# Patient Record
Sex: Female | Born: 1945 | Race: White | Hispanic: No | Marital: Married | Smoking: Never smoker
Health system: Southern US, Community
[De-identification: ages and names within clinical notes are randomized; demographics above are authoritative.]

## PROBLEM LIST (undated history)

## (undated) DIAGNOSIS — J189 Pneumonia, unspecified organism: Secondary | ICD-10-CM

## (undated) DIAGNOSIS — K219 Gastro-esophageal reflux disease without esophagitis: Secondary | ICD-10-CM

## (undated) DIAGNOSIS — K5190 Ulcerative colitis, unspecified, without complications: Secondary | ICD-10-CM

## (undated) DIAGNOSIS — I341 Nonrheumatic mitral (valve) prolapse: Secondary | ICD-10-CM

## (undated) DIAGNOSIS — D591 Autoimmune hemolytic anemia, unspecified: Secondary | ICD-10-CM

## (undated) DIAGNOSIS — E039 Hypothyroidism, unspecified: Secondary | ICD-10-CM

## (undated) DIAGNOSIS — S62102A Fracture of unspecified carpal bone, left wrist, initial encounter for closed fracture: Secondary | ICD-10-CM

## (undated) DIAGNOSIS — S82201A Unspecified fracture of shaft of right tibia, initial encounter for closed fracture: Secondary | ICD-10-CM

## (undated) DIAGNOSIS — R011 Cardiac murmur, unspecified: Secondary | ICD-10-CM

## (undated) DIAGNOSIS — Z9289 Personal history of other medical treatment: Secondary | ICD-10-CM

## (undated) HISTORY — PX: EXPLORATORY LAPAROTOMY: SUR591

## (undated) HISTORY — PX: LAPAROTOMY: SHX154

## (undated) HISTORY — DX: Cardiac murmur, unspecified: R01.1

## (undated) HISTORY — DX: Pneumonia, unspecified organism: J18.9

## (undated) HISTORY — PX: APPENDECTOMY: SHX54

## (undated) HISTORY — DX: Hypothyroidism, unspecified: E03.9

## (undated) HISTORY — PX: TONSILLECTOMY: SUR1361

## (undated) HISTORY — DX: Nonrheumatic mitral (valve) prolapse: I34.1

## (undated) HISTORY — PX: SPLENECTOMY: SUR1306

---

## 1997-12-25 ENCOUNTER — Ambulatory Visit (HOSPITAL_COMMUNITY): Admission: RE | Admit: 1997-12-25 | Discharge: 1997-12-25 | Payer: Self-pay | Admitting: Pediatrics

## 1998-01-07 ENCOUNTER — Encounter (HOSPITAL_COMMUNITY): Admission: RE | Admit: 1998-01-07 | Discharge: 1998-04-07 | Payer: Self-pay | Admitting: Pediatrics

## 1998-01-15 ENCOUNTER — Inpatient Hospital Stay (HOSPITAL_COMMUNITY): Admission: EM | Admit: 1998-01-15 | Discharge: 1998-01-17 | Payer: Self-pay | Admitting: Emergency Medicine

## 2002-02-16 ENCOUNTER — Emergency Department (HOSPITAL_COMMUNITY): Admission: EM | Admit: 2002-02-16 | Discharge: 2002-02-16 | Payer: Self-pay | Admitting: Emergency Medicine

## 2002-02-16 ENCOUNTER — Encounter: Payer: Self-pay | Admitting: Emergency Medicine

## 2003-01-25 ENCOUNTER — Encounter: Payer: Self-pay | Admitting: Emergency Medicine

## 2003-01-25 ENCOUNTER — Emergency Department (HOSPITAL_COMMUNITY): Admission: EM | Admit: 2003-01-25 | Discharge: 2003-01-25 | Payer: Self-pay | Admitting: *Deleted

## 2003-01-29 ENCOUNTER — Ambulatory Visit (HOSPITAL_COMMUNITY): Admission: RE | Admit: 2003-01-29 | Discharge: 2003-01-29 | Payer: Self-pay | Admitting: Cardiology

## 2003-05-30 ENCOUNTER — Encounter: Payer: Self-pay | Admitting: Gastroenterology

## 2003-05-30 ENCOUNTER — Encounter: Admission: RE | Admit: 2003-05-30 | Discharge: 2003-05-30 | Payer: Self-pay | Admitting: Gastroenterology

## 2003-07-10 ENCOUNTER — Encounter: Admission: RE | Admit: 2003-07-10 | Discharge: 2003-07-10 | Payer: Self-pay | Admitting: Obstetrics and Gynecology

## 2003-08-28 ENCOUNTER — Other Ambulatory Visit: Admission: RE | Admit: 2003-08-28 | Discharge: 2003-08-28 | Payer: Self-pay | Admitting: Obstetrics and Gynecology

## 2003-12-12 ENCOUNTER — Observation Stay (HOSPITAL_COMMUNITY): Admission: RE | Admit: 2003-12-12 | Discharge: 2003-12-13 | Payer: Self-pay | Admitting: Obstetrics and Gynecology

## 2004-11-17 ENCOUNTER — Inpatient Hospital Stay (HOSPITAL_COMMUNITY): Admission: EM | Admit: 2004-11-17 | Discharge: 2004-11-21 | Payer: Self-pay | Admitting: Emergency Medicine

## 2004-11-17 ENCOUNTER — Ambulatory Visit: Payer: Self-pay | Admitting: Physical Medicine & Rehabilitation

## 2005-05-26 ENCOUNTER — Encounter (HOSPITAL_COMMUNITY): Admission: RE | Admit: 2005-05-26 | Discharge: 2005-08-24 | Payer: Self-pay | Admitting: Endocrinology

## 2005-09-24 ENCOUNTER — Ambulatory Visit (HOSPITAL_COMMUNITY): Admission: RE | Admit: 2005-09-24 | Discharge: 2005-09-24 | Payer: Self-pay | Admitting: Endocrinology

## 2005-12-13 ENCOUNTER — Ambulatory Visit (HOSPITAL_BASED_OUTPATIENT_CLINIC_OR_DEPARTMENT_OTHER): Admission: RE | Admit: 2005-12-13 | Discharge: 2005-12-13 | Payer: Self-pay | Admitting: Specialist

## 2006-02-02 ENCOUNTER — Ambulatory Visit (HOSPITAL_COMMUNITY): Admission: RE | Admit: 2006-02-02 | Discharge: 2006-02-02 | Payer: Self-pay | Admitting: Endocrinology

## 2006-06-01 ENCOUNTER — Encounter (HOSPITAL_COMMUNITY): Admission: RE | Admit: 2006-06-01 | Discharge: 2006-06-01 | Payer: Self-pay | Admitting: Endocrinology

## 2006-07-26 ENCOUNTER — Inpatient Hospital Stay (HOSPITAL_COMMUNITY): Admission: AD | Admit: 2006-07-26 | Discharge: 2006-07-27 | Payer: Self-pay | Admitting: Orthopedic Surgery

## 2006-11-07 ENCOUNTER — Encounter (HOSPITAL_COMMUNITY): Admission: RE | Admit: 2006-11-07 | Discharge: 2007-01-18 | Payer: Self-pay | Admitting: Endocrinology

## 2007-05-18 ENCOUNTER — Encounter (HOSPITAL_COMMUNITY): Admission: RE | Admit: 2007-05-18 | Discharge: 2007-07-12 | Payer: Self-pay | Admitting: Endocrinology

## 2010-12-25 NOTE — Op Note (Signed)
NAME:  Joan [name], BROCKMAN                         ACCOUNT NO.:  [id number]   MEDICAL RECORD NO.:  [id number]                   PATIENT TYPE:  OBV   LOCATION:  9399                                 FACILITY:  [facility]   PHYSICIAN:  Hal Morales, M.D.             DATE OF BIRTH:  Nov 13, 1945   DATE OF PROCEDURE:  12/12/2003  DATE OF DISCHARGE:                                 OPERATIVE REPORT   PREOPERATIVE DIAGNOSIS:  Symptomatic cystocele and desire for uterine  preservation.   POSTOPERATIVE DIAGNOSIS:  Symptomatic cystocele and desire for uterine  preservation.   OPERATION:  Anterior colporrhaphy.   ANESTHESIA:  General LMA.   SURGEON:  Dr. Dierdre Forth.   FIRST ASSISTANT:  Henreitta Leber, P.A.   ESTIMATED BLOOD LOSS:  Minimal, less than 50 mL.   COMPLICATIONS:  None.   FINDINGS:  The patient had a cystocele that came to within 4 cm of the  introitus.  She also had some cervical descensus.   DESCRIPTION OF PROCEDURE:  The patient was taken to the operating room after  appropriate identification and placed on the operating table.  After the  attainment of adequate anesthesia she was placed in the modified lithotomy  position.  Perineum and vagina were prepped with multiple layers of Betadine  and draped as a sterile field.  A Foley catheter was inserted into the  bladder and connected to straight drainage.  A weighted speculum was placed  in the posterior vagina.  Allis clamps were placed at the urethrovesical  junction and the anterior vaginal mucosa injected with a dilute solution of  Pitressin along the midline.  The anterior vaginal mucosa was then incised  in the midline from the cervical vaginal junction to the urethrovesical  angle.  The anterior vaginal mucosa was then dissected off the pubocervical  fascia on the right and left sides along that length.  The pubocervical  fascia was then reapproximated in the midline to allow reduction of the  cystocele and kept in  place with mattress sutures of 2-0 chromic in two  layers.  The redundant vaginal mucosa was then excised and the remaining  vaginal mucosa reapproximated in the midline with a running suture of 2-0  chromic.  Hemostasis was noted to be adequate.  Vaginal packing moistened  with Estrace cream was then placed in the vagina and the patient awakened  from general anesthesia and taken to the recovery room in satisfactory  condition having tolerated the procedure well, with sponge and instrument  counts correct.                                               Hal Morales, M.D.    VPH/MEDQ  D:  12/12/2003  T:  12/12/2003  Job:  [id number]

## 2010-12-25 NOTE — H&P (Signed)
NAME:  Joan [name], Joan [name]                         ACCOUNT NO.:  [id number]   MEDICAL RECORD NO.:  [id number]                   PATIENT TYPE:  OIB   LOCATION:  2899                                 FACILITY:  [facility]   PHYSICIAN:  Colleen Can. Deborah Chalk, M.D.            DATE OF BIRTH:  [birth year]   DATE OF ADMISSION:  01/29/2003  DATE OF DISCHARGE:                                HISTORY & PHYSICAL   CHIEF COMPLAINT:  Chest pain.   HISTORY OF PRESENT ILLNESS:  The patient is a very pleasant 65 year old  white female who has had a history of exertional chest pain and has been  previously evaluated with negative stress Cardiolite testing. She has had a  rate related left bundle branch block. She has had a 2-D echocardiogram,  which showed trace mitral regurgitation, impaired relaxation as well as mild  aortic valve sclerosis with LV function of 60-65%. She has had ongoing bouts  of chest pain refractory to medical therapy, which includes beta blocker,  sublingual nitroglycerin. She was seen this past Friday in the emergency  room and was felt to have chest wall discomfort, which was treated with  Darvocet and Naprosyn. She presents to the office on 01/28/03 with recurrent  spells of chest pain. She is now referred for elective cardiac  catheterization.   PAST MEDICAL HISTORY:  1. Left bundle branch block.  2. Chest pain with previous stress Cardiolite testing by Dr. Jenne Campus on     December 06, 2002, which showed diaphragmatic attenuation without ischemia     and ejection fraction of 77%.  3. History of intraventricular conduction disturbance.  4. Palpitations.  5. Colitis.  6. Previous history of autoimmune hemolytic anemia requiring splenectomy.  7. History of a heart murmur with most recent 2-D echocardiogram as     described above.   ALLERGIES:  Aminoglycosides and morphine.   CURRENT MEDICATIONS:  1. Nafoxadol 500 mg 2 tablets b.i.d. for colitis.  2. Pen-Vee-K 500 mg b.i.d. for  colitis.  3. Synthroid daily.  4. Cytomel daily.  5. Fiber capsules two daily.  6. Fish oil one capsule three times a day.  7. Calcium twice a day.  8. Aspirin daily.  9. Vitamins and minerals.   SOCIAL HISTORY:  She is married. she has no current alcohol or tobacco.   FAMILY HISTORY:  Father died at 73 with an MI and was a smoker. Mother died  at 62 with a stroke. She has had one sister who has had heart trouble and  multiple aunts and uncles with heart trouble as well. She has four children.  Her 58 year old son has cystic fibrosis and had a myocardial infarction at  the age of 73.   REVIEW OF SYMPTOMS:  Basically as noted above and is otherwise unremarkable.   PHYSICAL EXAMINATION:  VITAL SIGNS:  Weight 143, blood pressure 120/80,  heart rate 70s, respirations 18. She is afebrile.  SKIN:  Warm and dry. Color is unremarkable.  LUNGS:  Clear.  HEART:  Regular rhythm. She does have mild chest wall tenderness upon  palpation.  ABDOMEN:  Soft, positive bowel sounds, nontender.  EXTREMITIES:  Without edema.  NEUROLOGIC:  Intact with no gross focal deficits.   LABORATORY DATA:  Labs are pending.   IMPRESSION:  1. Recurrent bouts of chest pain of uncertain etiology.  2. Known rate related left bundle branch block.  3. Recently negative stress Cardiolite testing.  4. Mild valvular disease.  5. Colitis.  6. Hypothyroidism.   PLAN:  We will proceed on with elective cardiac catheterization. The  procedure has been reviewed in full detail and she is willing to proceed on  Tuesday, January 29, 2003.     Juanell Fairly C. Earl Gala, N.P.                 Colleen Can. Deborah Chalk, M.D.    LCO/MEDQ  D:  01/29/2003  T:  01/29/2003  Job:  [id number]   cc:   Cassell Clement, M.D.  [address]  [place]  [place] [address]  Fax: [phone]    cc:   Cassell Clement, M.D.  [address], [address]  [place]  [place] [address]  Fax: [phone]

## 2010-12-25 NOTE — Op Note (Signed)
[name], Joan [name]               ACCOUNT NO.:  [id number]   MEDICAL RECORD NO.:  [id number]          PATIENT TYPE:  AMB   LOCATION:  NESC                         FACILITY:  [facility]   PHYSICIAN:  Jene Every, M.D.    DATE OF BIRTH:  1945-09-16   DATE OF PROCEDURE:  12/13/2005  DATE OF DISCHARGE:                                 OPERATIVE REPORT   PREOPERATIVE DIAGNOSIS:  Painful retained hardware of the right leg status  post intramedullary nailing of the right tibia.   POSTOPERATIVE DIAGNOSIS:  Painful retained hardware of the right leg status  post intramedullary nailing of the right tibia.   PROCEDURE PERFORMED:  1.  Removal of distal locking screw of the right tibial rod.  2.  Removal of both proximal locking screws of the tibial rod.   ANESTHESIA:  General local.   BRIEF HISTORY INDICATIONS:  This is a 65 year old status post intramedullary  rodding of the mid tib-fib fracture that has gone on to heal and she has had  painful retained hardware, more of the proximal end of the most distal screw  head.  She was nontender over the proximal screw of the distal set.  She was  indicated for removal of the proximal screws and the distal screw.  I have  discussed removing all of the screws; however, I prefer she have one  transfixion screw to keep the rod from migrating.  We then discussed the  risks and benefits including bleeding and infection, need for eventual rod  removal, etc.   TECHNIQUE:  With the patient in the supine position, after the induction of  adequate general anesthesia and 1g Kefzol, the right lower extremity was  thoroughly prepped and draped in the usual sterile fashions.  The distal  screw head was palpated just proximal to the medial malleolus.  Incision was  made through the skin and then bluntly dissected down through the  subcutaneous tissue.  Granulation tissue was noted over the screw head, this  was removed, the screw head was identified.  It was  removed without  difficulty.  The screw hole was irrigated and curetted and then closed with  a #4-0 nylon simple suture.  In a similar fashion, the two proximal screws  were removed, incisions through the skin only, and blunt dissection down to  the screw head, identification of the screw head and removal of the screw  with the screwdriver without difficulty.  The tips were intact.  We curetted  both screw holes.  We copiously irrigated them.  Both were infiltrated with  0.25% Marcaine with epinephrine.  A nylon suture was utilized to close the  skin.  The the wound was dressed with Adaptic, 4 x 4's and Elastoplast tape  and Hypafix tape.   Post-removal radiographs were satisfactory.   Patient was then awoken without difficulty and transported to the recovery  room in satisfactory condition.   The patient tolerated the procedure well, there were no complications.      Jene Every, M.D.  Electronically Signed     JB/MEDQ  D:  12/13/2005  T:  12/14/2005  Job:  [id number]

## 2010-12-25 NOTE — Op Note (Signed)
NAMEVELDA, Joan [name]               ACCOUNT NO.:  [id number]   MEDICAL RECORD NO.:  [id number]          PATIENT TYPE:  OIB   LOCATION:  1521                         FACILITY:  [facility]   PHYSICIAN:  Dionne Ano. Gramig III, M.D.DATE OF BIRTH:  10/07/1945   DATE OF PROCEDURE:  07/25/2006  DATE OF DISCHARGE:                               OPERATIVE REPORT   PREOPERATIVE DIAGNOSIS:  Comminuted complex left distal radius and ulna  fracture with greater than 5 fragments about the radius, extremity  painful extensor pollicis longus excursion, and noted severe  displacement of the fracture.   PREOPERATIVE DIAGNOSIS:  Comminuted complex left distal radius and ulna  fracture with greater than 5 fragments about the radius, extremity  painful extensor pollicis longus excursion, and noted severe  displacement of the fracture.   PROCEDURE:  1. Open reduction internal fixation left distal radius fracture,      greater than 5 parts with DVR plate and allograft bone graft from      AlloMatrix bone graft from right medical.  2. An EPL tendon transfer/decompression left wrist.  3. Posterior interosseous nerve neurectomy left wrist.  4. Stress radiography left wrist.  5. Closed treatment ulnar shaft fracture left wrist.   SURGEON:  Dionne Ano. Amanda Pea, M.D.   ASSISTANT:  Karie Chimera, P.A.-C.   TIME:  Less than 1 hour.   DRAINS:  1 TLS drain was placed   INDICATIONS FOR THE PROCEDURE:  This patient is a 65 year old female who  presents with the above-mentioned diagnosis.  I have counseled her in  regards to the risks and benefits of surgery including risk of  infection, bleeding, anesthesia, damage to normal structures, and  failure of surgery to accomplish its intended goals of relieving  symptoms and restoring function.  With this in mind, they desire to  proceed.  All questions have been encouraged and answered  preoperatively.   I have specifically discussed her risk of infection,  bleeding,  dystrophy, malunion, nonunion, chronic pain, stiffness, arthrofibrosis,  etcetera; with this in mind they desire to proceed.  All questions have  been encouraged and answered preoperatively.   OPERATION IN DETAIL:  __________ anesthesia.  She has taken to the  operating room and had induction of general endotracheal anesthesia; was  laid supine, properly padded, and prepped and draped in the usual  sterile fashion about the left upper extremity.  The patient had her  rings carefully removed by myself.  This was a very tight ring fit.  Nevertheless, we were able to get her rings off.  I discussed her  possible severance of the ring and other issues.  She has significant  swelling, but we were able remove these.  Following this, she was  prepped with Betadine scrub and paint.  Sterile field was secured.  The  arm was elevated and tourniquet was inflated to 250 mmHg; and a volar  radial incision was made about the wrist.  Dissection was carried down  and FCR tendon sheath and volar forearm fascia was released.  I then  swept the FCR and carpal canal contents ulnarly inside  the pronator  quadratus from radial-to-ulnar, accessed the fracture site, and reduce  this.  I used finger trap traction and manual reduction technique and  then applied a DVR hand innovations plate.  This was applied in the  standard AO technique.  This achieved excellent fixation.  Following  this, x-rays were reviewed which showed the large dorsal V-defect.   At this time I turned attention dorsally and made an incision.  I  decompressed the EPL tendon.  There was actually too slips and these  transferred to the dorsal soft tissues.  Following tendon  transplantation, dorsally,  I then performed complete decompression and  release of the fascia followed by a posterior interosseous nerve  neurectomy.   The posterior interosseous nerve was identified and excised along a 3.5-  4 cm segment; crushing and  cauterization technique was used; and was  allowed to retract proximally.  Following this, the patient then  underwent bone grafting of the dorsal V defect with a large amount of  AlloMatrix bone graft from the [facility].   Following this, the patient underwent live stress radiography, revealing  excellent position of the radius.  The plate and screw construct looked  excellent as did the position.  The ulnar shaft fracture was identified  and stress tested and did not move, appreciably; thus decision for  closed treatment was performed.  Following this, I then irrigated  copiously with tourniquet down, repaired the pronator quadratus with  Vicryl, and the subcu with Vicryl.  I placed he TLS drain, and closed  the skin with Prolene.  The patient's dorsal incision was closed with  Prolene.  Then 20-25 mL of 1/4% Sensorcaine without IV epinephrine was  placed for postop analgesia.  She had excellent soft compartments, good  refill, and no complicating features.  Sterile dressing and sugar-tong  splint were applied.  At this point, she was taken to recovery room  where she will be admitted for 23-hour observatory status for IV  antibiotics, pain management, etcetera.  I have discussed with her the  do's and don'ts  etcetera and all questions were encouraged and  answered.           ______________________________  Dionne Ano. Everlene Other, M.D.     Nash Mantis  D:  07/25/2006  T:  07/26/2006  Job:  [id number]

## 2010-12-25 NOTE — Discharge Summary (Signed)
NAME:  Joan [name], Joan [name]                         ACCOUNT NO.:  [id number]   MEDICAL RECORD NO.:  [id number]                   PATIENT TYPE:  OBV   LOCATION:  9303                                 FACILITY:  [facility]   PHYSICIAN:  Hal Morales, M.D.             DATE OF BIRTH:  1945-11-23   DATE OF ADMISSION:  12/12/2003  DATE OF DISCHARGE:  12/13/2003                                 DISCHARGE SUMMARY   DISCHARGE DIAGNOSES:  Symptomatic cystocele with the desire for uterine  preservation.   OPERATIONS:  On the day of admission, the patient underwent an anterior  repair. Tolerated the procedure well. The patient was found to have a grade  3 cystocele with mild uterine descensus.   HISTORY OF PRESENT ILLNESS:  Joan [name] is a 65 year old married white  female, para 4-0-4-4- who presents for anterior repair due to a symptomatic  cystocele. Please see the patient's dictated history and physical  examination for details.   PHYSICAL EXAMINATION:  VITAL SIGNS:  Blood pressure 120/74, weight 134  pounds, height 5 feet 3 inches tall.  GENERAL:  Within normal limits.  PELVIC:  EG BUS was within normal limits. Vagina is rugous with a grade 3  cystocele, which is visible within 4 cm of the vaginal introitus (UV angle  is maintained even through Valsalva). Cervix without tenderness or lesions.  Uterus appears normal size, shape, and consistency without tenderness.  Adnexa was without tenderness or masses.  RECTAL:  Rectovaginal without tenderness or mass. The patient does have  small hemorrhoids.   HOSPITAL COURSE:  On the day of admission the patient underwent  aforementioned procedure, tolerating it well. The patient experienced some  postoperative nausea, which was responsive to IV anti-emetics. Her  postoperative hemoglobin was 11.3 (preoperative  hemoglobin 13.7). By  postoperative day 1, the patient had achieved the maximum benefit of her  hospital stay, in that she had resumed normal  bowel and bladder function and  had achieved good pain control with her p.o. medication.   DISPOSITION:  The patient was therefore discharged home.   DISCHARGE MEDICATIONS:  1. Vicodin 1 or 2 tablets every 4 to 6 hours as needed for pain.  2. Phenergan 12.5 mg 1 tablet every 6 hours as needed for nausea.  3. Ibuprofen 1 tablet with food every 6 hours for 3 days, then as needed for     pain.  4. Colace 100 mg 1 or 2 tablets daily until bowel movement are regular.   FOLLOW UP:  The patient is scheduled for followup with Dr. Pennie Rushing on January 23, 2004 at 2:30 p.m. Follow postoperative instructions. The patient was  given a copy of [facility]  postoperative instruction sheet.   ACTIVITY:  To avoid driving for 2 weeks, heavy lifting for 4 weeks, and  intercourse for 6 weeks.   DIET:  Without restrictions.   FINAL PATHOLOGY:  Not applicable.     [place] J. Adline Peals.                    Hal Morales, M.D.    EJP/MEDQ  D:  12/24/2003  T:  12/25/2003  Job:  [id number]

## 2010-12-25 NOTE — Discharge Summary (Signed)
[name], [name]               ACCOUNT NO.:  [id number]   MEDICAL RECORD NO.:  [id number]          PATIENT TYPE:  INP   LOCATION:  2021                         FACILITY:  [facility]   PHYSICIAN:  Jene Every, M.D.    DATE OF BIRTH:  1945/08/29   DATE OF ADMISSION:  11/17/2004  DATE OF DISCHARGE:  11/21/2004                                 DISCHARGE SUMMARY   ADMISSION DIAGNOSES:  Include:  1.  Right tibial fracture.  2.  History of ulcerative colitis.  3.  Hypertension.  4.  Hypothyroidism.  5.  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   DISCHARGE DIAGNOSES:  Include:  1.  Right tibial fracture.  2.  History of ulcerative colitis.  3.  Hypertension.  4.  Hypothyroidism.  5.  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  6.  Status post intramedullary nail of the right tibia.   CONSULTS:  1.  PT.  2.  OT.  3.  Case Management.  4.  Hospitalist.   PROCEDURES:  The patient was taken to the OR on November 17, 2004, to undergo  IM nailing of right tibia-fibula.   SURGEON:  Jene Every, M.D.   ASSISTANT:  Roma Schanz, P.A.-C.   ANESTHESIA:  General.   COMPLICATIONS:  None.   HISTORY:  Joan [name] is a 65 year old female who states she was out walking  in the yard when she tripped over a PVC pipe, twisting and landing on her  right lower extremity with immediate pain.  She was brought by EMS to [facility] Emergency Room.  X-rays revealed displaced tibia fracture.  It was felt  at this time the patient would benefit from IM nailing.  The risks and  benefits of the surgery were discussed with the patient as well as her  husband and they wished to proceed.   PREOPERATIVE LABS:  Showed a white cell count of 17.5, hemoglobin 10.9,  hematocrit 33.2.  Routine CBCs followed throughout the hospital course.  At  the time of discharge the white count was 12.1, hemoglobin 8.9, hematocrit  27.0, as well as MCV of 82, RDW 14.3 and platelets of 396 at  time of  discharge.   ROUTINE CHEMISTRIES DONE PREOPERATIVELY:  Showed a sodium of 134, potassium  3.7, glucose of 130, decreased calcium of 8.1.  Routine chemistries were  followed.  At the time of discharge today, sodium is 136, potassium 3.5,  glucose of 86, BUN 10, creatinine 0.8, calcium 8.3.  Routine liver function  tests were normal.  Hemoglobin A1c 6.0.  Coagulation studies done  preoperatively showed a PT of 13.4, INR of 1.1; these were followed  throughout the hospital course.  At the time of discharge she was  therapeutic with a PT of 19.2, INR of 2.0.   Preoperative urinalysis negative.  Preoperative chest x-ray showed  cardiomegaly with cardiovascular congestion, no acute findings were  demonstrated.  Preoperative EKG showed normal sinus rhythm.   HOSPITAL COURSE:  The patient was admitted, medical clearance was obtained.  There was a slight concern about her white cell  count; however, they did not  see any contraindication for surgery.  IV Cipro and Flagyl were started for  GI source of possible preoperative infection.  The patient underwent the  above stated procedure without difficulty.  She was then transferred to the  PACU and then to the orthopedic floor for continued postoperative care.  Internal Medicine continued to follow along with [place] postoperatively for  medical management.  Coumadin was started per pharmacy protocol for DVT  prophylaxis.  Postoperatively, the patient did fairly well, pain was fairly  well controlled.  She denied chest pain, shortness of breath and diarrhea.  Vital signs remained stable.  White cell count trended downward, slight  decrease in her hemoglobin; however, she was asymptomatic from this.  Patient had minimal draining from her incisions, capillary reflux brisk,  lateral compartments were soft.  PT, OT was consulted for touchdown  weightbearing in the right lower extremity.  Case Management was consulted  for home health needs.   Patient did note nausea secondary to Dilaudid PCA,  this was discontinued.  Demerol, Coumadin were reinstituted due to the fact  that she had tolerated this well in the past.  Patient did have some  episodes of hypertension.  Her Toprol was held and resumed as per Internal  Medicine.  Postoperative day #2, the patient did spike temp with a max of  100.5.  Incentive spirometer was encouraged.  White cell count began  trending upward again.  By postoperative day #3, patient was doing  significantly better, vital signs were stable, she was afebrile, __________  resolved and neurovascular and motor function remained intact, incision was  clean and dry with no evidence of infection.  Patient was advancing fairly  well with physical therapy.  Her medications were adjusted, as per Medicine,  secondary to nausea.  Postoperative day #5, patient was doing fairly well  and she is progressing slow with physical therapy.  Dr. Ophelia Charter did discuss  with her husband that he felt she was ready for discharge to home.  All  questions were answered with the family.  Home health therapy needs were  arranged as well as Coumadin monitoring.  Durable medical beds were  requested for home need.   DISPOSITION:  The patient was discharged home with her husband, as well as  home health therapy and Coumadin monitoring.  Also, she will followup with  Dr. Shelle Iron in approximately 2 weeks.  She should continue performing toe  touch weightbearing to the right lower extremity, aggressive ice and  elevation.  She should utilize knee immobilizer and morphine, change  dressing daily as well as taking a shower.   DISCHARGE MEDICATIONS:  Vicodin 5/325, Coumadin per pharmacy, Trinsicon,  Robaxin.   DIET:  As tolerated.   CONDITION ON DISCHARGE:  Stable, improving.   FINAL DIAGNOSIS:  Status intramedullary nail of the right tibia.      CS/MEDQ  D:  02/25/2005  T:  02/25/2005  Job:  [id number]

## 2010-12-25 NOTE — H&P (Signed)
NAME:  Joan [name], Joan [name]                         ACCOUNT NO.:  [id number]   MEDICAL RECORD NO.:  [id number]                   PATIENT TYPE:  INP   LOCATION:  NA                                   FACILITY:  [facility]   PHYSICIAN:  Elmira J. Lowell Guitar, P.A.              DATE OF BIRTH:  14-Mar-1946   DATE OF ADMISSION:  DATE OF DISCHARGE:                                HISTORY & PHYSICAL   HISTORY OF PRESENT ILLNESS:  Joan [name] is a 65 year old, married, white  female, para 4-0-4-4, who presents for anterior repair due to symptomatic  cystocele.  For the past year, the patient has experienced a sensation of  incomplete bladder emptying, postvoid dribbling, and urgency which has  progressed to a point of being very disruptive.  She denies any  incontinence, hematuria, vaginitis symptoms, nausea, vomiting, diarrhea,  kidney stones, or pelvic pain.  The patient has tried regular Kegel  exercises for management of her symptoms, however, now wishes to proceed  with anterior repair.   OBSTETRICAL HISTORY:  Gravida 8, para 4-0-4-4.  The patient experienced  anemia during her pregnancy.   GYNECOLOGIC HISTORY:  Menarche at 65 years old.  The patient is menopausal.  Denies any history of abnormal Pap smears or sexually transmitted diseases.  Her last normal Pap smear was in January of 2005.  Her last normal mammogram  was in December of 2004.   PAST MEDICAL HISTORY:  Positive for:  1. Aortic and mitral valve prolapse.  2. Thyroid disease.  3. Ulcerative colitis (last flare in June of 2004 secondary to medication).  4. Anemia.  5. Positive antinuclear antibody.   PAST SURGICAL HISTORY:  1. Tonsillectomy in 1957.  2. 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   The patient admits to being very sensitive to anesthesia which causes her  profound sedation.   FAMILY HISTORY:  Positive for heart disease and asthma.   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   HABITS:  The patient does not use tobacco or alcohol.   CURRENT MEDICATIONS:  1. Synthroid 1.5 mcg daily.  2. Toprol XL 50 mg one to two tablets daily as needed.  3. Calcium 1000 mg a day.  4. Omega-3 essential fatty acids one tablet daily.  5. Probiotics daily.  6. Multivitamins with minerals daily.  7. SAM-e one tablet daily.  8. Vitamin B complex daily.   The patient was advised to discontinue omega-3 essential fatty acids and SAM-  e five days prior to surgery.   ALLERGIES:  The patient is allergic to AMINOGLYCOSIDES, which cause her  colitis to flare up.   REVIEW OF SYSTEMS:  Negative, except as mentioned in the history of present  illness.   PHYSICAL EXAMINATION:  VITAL SIGNS:  Blood pressure 120/74.  WEIGHT:  134 pounds.  HEIGHT:  5 feet 3 inches tall.  NECK:  Supple.  There is no adenopathy or thyromegaly.  HEART:  Regular rate and rhythm.  There is no murmur.  CHEST:  Clear to auscultation.  There are no wheezing, rhonchi, or rales.  BACK:  No CVA tenderness.  ABDOMEN:  Bowel sounds are present.  It is soft without tenderness,  guarding, rebound, or organomegaly.  EXTREMITIES:  Without cyanosis, clubbing, or edema.  PELVIC:  EG and BUS are within normal limits.  The vagina is rugous with a  grade 3 cystocele which is visible within 4 cm of the vaginal introitus (UV  angle is maintained even through Valsalva).  Cervix without tenderness or  lesions.  Uterus normal in size, shape, and consistency without tenderness.  Adnexa without tenderness or masses.  Rectovaginal without tenderness or  masses.  The patient does have small hemorrhoids.   IMPRESSION:  Symptomatic cystocele.   DISPOSITION:  A discussion held with the patient regarding the implications  for her procedure along with its risks, which include, but are not limited  to reaction to anesthesia, damage to adjacent organs, infection, and   excessive bleeding.  The patient further states that she plans to go home  her day of surgery and therefore will take all responsibility for any  untoward events which may occur as a result.  She also understands that her  postoperative care, which typically includes, but is not limited to:  Monitoring of urine output, vital signs/temperature, removal of Foley  catheter and vaginal packing, and management of urinary retention should it  occur, will all be a part of her responsibility.  Given this agreement, the  patient has consented to proceed with an anterior repair at the [facility] [place] on Dec 12, 2003, at 7:30 a.m.                                               Elmira J. Adline Peals.    EJP/MEDQ  D:  12/06/2003  T:  12/06/2003  Job:  [id number]

## 2010-12-25 NOTE — Cardiovascular Report (Signed)
NAME:  Joan [name], Joan [name]                         ACCOUNT NO.:  [id number]   MEDICAL RECORD NO.:  [id number]                   PATIENT TYPE:  OIB   LOCATION:  2899                                 FACILITY:  [facility]   PHYSICIAN:  Colleen Can. Deborah Chalk, M.D.            DATE OF BIRTH:  [birth year]   DATE OF PROCEDURE:  01/29/2003  DATE OF DISCHARGE:                              CARDIAC CATHETERIZATION   HISTORY:  The patient is referred for evaluation of chest pain that has been  persistent in spite of non-invasive evaluations.   PROCEDURE:  Left heart catheterization with selective coronary angiography,  left ventricular angiography, and Perclose.   TYPE AND SITE OF ENTRY:  Percutaneous, right femoral artery.   CATHETERS:  6-French 4 curved Judkins right and left coronary catheters; 6-  French 3.5 curved Judkins left and right coronary catheters; 6-French  pigtail ventriculographic catheter.   CONTRAST MATERIAL:  Omnipaque.   MEDICATIONS GIVEN PRIOR TO THE PROCEDURE:  Valium 10 mg p.o.   MEDICATIONS GIVEN DURING THE PROCEDURE:  Versed 3 mg IV.   COMMENTS:  The patient tolerated the procedure well.  With ventricular  ectopy during the left ventricular angiogram she had an undue amount of  chest discomfort.   HEMODYNAMIC DATA:  1. The aortic pressure was 116/60.  2. LV was initially 131/4-23.  However, on pullback there was no aortic     valve gradient present.   ANGIOGRAPHIC DATA:  The left ventricular angiogram was performed in the RAO  position.  Overall cardiac size and silhouette were normal.  Global left  ventricular function showed an ejection fraction of 55-60% with normal  regional wall motion.  There was no significant mitral regurgitation noted.   CORONARY ARTERIES:  The coronary arteries arise.  There is a very short or  non-existent left main and perhaps dual ostia.  It was very difficult to get  nonselective views of the left circumflex and left anterior descending.  1. The left anterior descending is a reasonably large vessel that crossed     the apex.  It is normal.  2. Left circumflex:  The left circumflex is a moderately large vessel that     extends to the apex and is tortuous at that level.  It is normal.  3. Right coronary artery:  The right coronary artery is best engaged with a     3.5 catheter.  There is an acute angle quickly after the origin of the     right coronary artery, but not a true Shepherd's crook.  The right     coronary artery is a small dominant vessel.  It is normal.    OVERALL IMPRESSION:  1. Normal left ventricular function.  2. Normal coronary arteries.  Colleen Can. Deborah Chalk, M.D.    SNT/MEDQ  D:  01/29/2003  T:  01/29/2003  Job:  [id number]

## 2010-12-25 NOTE — Consult Note (Signed)
Joan [name], BENTLY               ACCOUNT NO.:  [id number]   MEDICAL RECORD NO.:  [id number]          PATIENT TYPE:  INP   LOCATION:  1823                         FACILITY:  [facility]   PHYSICIAN:  Hollice Espy, M.D.DATE OF BIRTH:  29-Mar-1946   DATE OF CONSULTATION:  11/17/2004  DATE OF DISCHARGE:                                   CONSULTATION   PRIMARY CARE PHYSICIAN:  Dario Guardian, M.D.   REASON FOR CONSULTATION:  Preoperative clearance.   HISTORY OF PRESENT ILLNESS:  The patient is a 65 year old white female with  a past medical history of splenectomy, ulcerative colitis, hypertension, and  hypothyroidism who presented after a fall and found to have an acute right  tibial fracture. [facility] hospitalist were consulted for preoperative  clearance. In addition the patient was noted to have an elevated white count  of 17 with shift. When the patient was evaluated she had received narcotics  for pain control and was in and out of consciousness, unable to give a  thorough history. She does tell me that she has had no problems with  urination lately. She has had no problems with shortness of breath or cough.  She tells she had an episode of diarrhea this morning, a significant amount,  prior to her fall. She said she did not think she was on antibiotics and a  more extensive review of systems is otherwise unable to be obtained. The  patient said she had no complaints other than some soreness in her right  leg.   PAST MEDICAL HISTORY:  1.  History of ulcerative colitis.  2.  Hypothyroidism.  3.  Hypertension.  4.  History of splenectomy.   MEDICATIONS:  As noted, the patient is on Toprol, Synthroid, and temazepam.  I do not see any medications for ulcerative colitis.   ALLERGIES:  AMINOGLYCOSIDES.   SOCIAL HISTORY:  Based on records she is not a smoker or drinker.   FAMILY HISTORY:  Noncontributory.   PHYSICAL EXAMINATION:  VITAL SIGNS: Initially on admission, temperature  98.5  up to 99.3, heart rate 90, blood pressure 140/84, respirations 20, O2  saturation 96% on room air.  GENERAL: The patient appears to be lethargic and drowsy. She is arousable,  oriented times two.  HEENT: Normocephalic and atraumatic. Her mucous membranes are slightly dry.  She has no carotid bruits.  HEART: Regular rate and rhythm with a 2/5 systolic ejection murmur.  LUNGS: Clear to auscultation bilaterally.  ABDOMEN: Soft, nontender, nondistended with hypoactive bowel sounds.  EXTREMITIES: Her right leg is casted in plans for coming surgery. Her left  leg shows no evidence of any clubbing, cyanosis, or edema with 2+ pulses.   LABORATORY DATA:  The patient's chest x-ray shows evidence of an enlarged  heart, but otherwise is unremarkable. White count is elevated at 17.5 with  an 88% shift. H&H 10.9 and 33.2, MCV 83, platelet count 503,000. Sodium 134,  potassium 3.7, chloride 103, bicarbonate 24, BUN 16, creatinine 0.7, glucose  130. LFTs are noted for a slightly low albumin of 3.1; otherwise normal.  Urinalysis is normal.  ASSESSMENT/PLAN:  1.  Acute right tibial fracture. Despite her increased white count I do not      see any contraindication for surgery. She has had no evidence of      ischemia. Her EKG looks good. Will go ahead and start IV Cipro and      Flagyl to cover for a GI source for possible infection preop, but      otherwise she is okay for surgery.  2.  Increased white count with shift. There is no evidence of any UCR or      pneumonia. Prior to a fall the patient had an episode of diarrhea which      may be incidental related to her ulcerative colitis or infectious. It is      possible that this increased white count could be stress related,      although a white count of 17 with a shift still might be more indication      of pathology. I would favor starting IV Cipro and Flagyl preoperatively      to cover for a GI source. It is possible that the patient may  have      colitis, however given her drowsy confused state secondary to her      narcotics she is not give me a history and I am not able to get a full      response from her. I would recommend starting IV Cipro and Flagyl      repeating her labs in the morning and if there is still increased white      count or if the patient when more lucid is complaining of significant      abdominal pain, then we could go ahead and check a CT of the abdomen.  3.  History of ulcerative colitis which appears to be questionably stable,      but see above note for further differential.  4.  Hypothyroidism. We can re-start her Synthroid once the patient is p.o.      Synthroid has a long half life and the patient can go weeks without it.  5.  Hypertension. I noticed her increased heart size on chest x-ray along      with her slight murmur. She is noted to have ischemia, but her EKG looks      good. The only recommendation would be re-start Toprol once she is p.o.  6.  History of splenectomy. Given the fact of her association that she is      married to her physician I am assuming the patient is up to date on her      IV vaccines. Otherwise she would be at increased risk for encapsulated      organisms, but in this case I think we are fine.      SKK/MEDQ  D:  11/17/2004  T:  11/17/2004  Job:  [id number]   cc:   Jene Every, M.D.  [address]  [place]  [place] [address]  Fax: [phone]   Dario Guardian, M.D.  [address]  [place]  [place] [address]  Fax: [phone]

## 2010-12-25 NOTE — Op Note (Signed)
Joan [name], Joan [name]               ACCOUNT NO.:  [id number]   MEDICAL RECORD NO.:  [id number]          PATIENT TYPE:  INP   LOCATION:  2550                         FACILITY:  [facility]   PHYSICIAN:  Jene Every, M.D.    DATE OF BIRTH:  03/10/1946   DATE OF PROCEDURE:  11/17/2004  DATE OF DISCHARGE:                                 OPERATIVE REPORT   PREOPERATIVE DIAGNOSIS:  Displaced closed mid shaft tibial fibular fracture.   POSTOPERATIVE DIAGNOSIS:  Displaced closed mid shaft tibial fibular  fracture.   PROCEDURE PERFORMED:  Intramedullary nailing, right tibial fibular fracture.   ANESTHESIA:  General.   SURGEON:  Jene Every, M.D.   ASSISTANT:  Roma Schanz, P.A.   BRIEF HISTORY/INDICATIONS:  This is a 65 year old who fell this morning,  sustaining a displaced tib/fib fracture, which was reduced in the emergency  room.  Placed in a split.  Due to the instable nature of the mid shaft  tib/fib fracture, she was indicated for IM nailing after the discussion of  risks and benefits versus closed IM nailing.  They chose the latter.  Discussed the risks and benefits, including bleeding, infection, damage to  vascular structures, DVT, PE, anesthetic complications, time to union, etc.   TECHNIQUE:  With the patient in the supine position after adequate induction  of general anesthesia and 400 mg of IV Cipro and 500 mg of IV Flagyl, the  right lower extremity was prepped and draped in the usual sterile fashion.  Then 0.25% Marcaine with epinephrine was infiltrated into the subcutaneous  tissue just medial to the patellar tendon.  An incision was made medial to  the patellar tendon.  Subcutaneous tissues were bluntly dissected with  electrocautery and utilized to achieve hemostasis.  Under x-ray, an awl was  utilized to mark the starting point in line with the AP central axis of the  tibia.  On the lateral, was entered into the canal, enlarged, and a guide  placed through that,  making sure not to penetrate the posterior cortex at  all times.  We over-reamed it proximally and inserted a reduction tool and a  skin protector, utilizing it proximally at all times as well.  Placed a  guide pin with the reduction maneuver.  Placed a reduction maneuver.  Advanced the guide pin across the fracture site into the distal physis of  the tibia.  It was found to be reduced and in the intramedullary canal and  the AP and lateral plane without difficulty.  I gradually reamed this to an  11 mm diameter after placing a 10 mm length rod.  This was measured both  distally and proximally to a 30 to be an optimal length.  The next size was  a 32.5.  Felt that would possibly be too long.  With utilizing the triangles  and padded triangles, flexed and inserted the rod over the guide pin.  Crossed the fracture site under direct x-ray, advancing to the distal tibial  physis without difficulty.  The guide pin was then removed.  The AP and  lateral plane was found to  be satisfactory.  The fracture was reduced  anatomically.  Proximally, with an external jig medial in the lateral  oblique, using an external alignment guide, I made an incision through the  appropriate area in the skin, bluntly dissected, and then drilled both  cortices and placed two screws of 45 and a 35 proximally, bicortical with  excellent purchase in the AP and lateral plane was found to be satisfactory.  Distally, we placed two screws from medial to lateral, utilizing the C-arm  incision, bluntly dissecting down to the lateral aspect of the tibia.  Utilizing a triangulation device and drilling medially to laterally.  Using  depth gauge and inserted the appropriate bicortical self-tapping screws with  excellent purchase in the AP and lateral plane, there was found to be within  rod and transfixing appropriately.  External alignment was found to be  excellent.  Good good range of motion of the knee.  Exam of the knee   following that, there was no loose ACL or PCL or medial lateral or  collateral ligament instability.  The compartments are soft.  Good pollices.  All wounds copiously irrigated.  Final radiographs are obtained.  Closing  the proximal wound with 2-0 Vicryl simple sutures, and the skin was  reapproximated with staples.  The proximal incisions and the distal  incisions were closed with staples.  The wound was dressed sterilely.  She  was secured with an Ace bandage.  She was then extubated without difficulty  and transported to the recovery room in satisfactory condition.   The patient tolerated the procedure well with no complications.  No  tourniquet was utilized.      JB/MEDQ  D:  11/17/2004  T:  11/17/2004  Job:  [id number]

## 2012-10-12 ENCOUNTER — Other Ambulatory Visit: Payer: Self-pay | Admitting: Family Medicine

## 2012-10-12 ENCOUNTER — Ambulatory Visit
Admission: RE | Admit: 2012-10-12 | Discharge: 2012-10-12 | Disposition: A | Discharge status: Home / Self Care | Payer: Medicare Other | Source: Ambulatory Visit | Attending: Family Medicine | Admitting: Family Medicine

## 2014-09-09 ENCOUNTER — Encounter (HOSPITAL_COMMUNITY): Payer: Self-pay | Admitting: Emergency Medicine

## 2014-09-09 ENCOUNTER — Emergency Department (HOSPITAL_COMMUNITY): Payer: Medicare Other

## 2014-09-09 ENCOUNTER — Observation Stay (HOSPITAL_COMMUNITY)
Admission: EM | Admit: 2014-09-09 | Discharge: 2014-09-11 | Disposition: A | Discharge status: Home / Self Care | Payer: Medicare Other | Attending: Surgery | Admitting: Surgery

## 2014-09-09 DIAGNOSIS — K5190 Ulcerative colitis, unspecified, without complications: Secondary | ICD-10-CM | POA: Insufficient documentation

## 2014-09-09 DIAGNOSIS — K3580 Unspecified acute appendicitis: Secondary | ICD-10-CM | POA: Diagnosis not present

## 2014-09-09 DIAGNOSIS — R109 Unspecified abdominal pain: Secondary | ICD-10-CM | POA: Diagnosis present

## 2014-09-09 DIAGNOSIS — D591 Other autoimmune hemolytic anemias: Secondary | ICD-10-CM | POA: Insufficient documentation

## 2014-09-09 HISTORY — DX: Other autoimmune hemolytic anemias: D59.1

## 2014-09-09 HISTORY — DX: Fracture of unspecified carpal bone, left wrist, initial encounter for closed fracture: S62.102A

## 2014-09-09 HISTORY — DX: Unspecified fracture of shaft of right tibia, initial encounter for closed fracture: S82.201A

## 2014-09-09 HISTORY — DX: Autoimmune hemolytic anemia, unspecified: D59.10

## 2014-09-09 HISTORY — DX: Ulcerative colitis, unspecified, without complications: K51.90

## 2014-09-09 LAB — LIPASE, BLOOD: Lipase: 35 U/L (ref 11–59)

## 2014-09-09 LAB — COMPREHENSIVE METABOLIC PANEL
ALBUMIN: 4.4 g/dL (ref 3.5–5.2)
ALK PHOS: 82 U/L (ref 39–117)
ALT: 21 U/L (ref 0–35)
AST: 32 U/L (ref 0–37)
Anion gap: 10 (ref 5–15)
BILIRUBIN TOTAL: 1.1 mg/dL (ref 0.3–1.2)
BUN: 19 mg/dL (ref 6–23)
CALCIUM: 9.6 mg/dL (ref 8.4–10.5)
CHLORIDE: 103 mmol/L (ref 96–112)
CO2: 24 mmol/L (ref 19–32)
Creatinine, Ser: 0.71 mg/dL (ref 0.50–1.10)
GFR, EST NON AFRICAN AMERICAN: 86 mL/min — AB (ref >90)
GLUCOSE: 87 mg/dL (ref 70–99)
POTASSIUM: 4 mmol/L (ref 3.5–5.1)
Sodium: 137 mmol/L (ref 135–145)
Total Protein: 7.9 g/dL (ref 6.0–8.3)

## 2014-09-09 LAB — CBC WITH DIFFERENTIAL/PLATELET
Basophils Absolute: 0 10*3/uL (ref 0.0–0.1)
Basophils Relative: 0 % (ref 0–1)
EOS PCT: 1 % (ref 0–5)
Eosinophils Absolute: 0.1 10*3/uL (ref 0.0–0.7)
HEMATOCRIT: 44.6 % (ref 36.0–46.0)
HEMOGLOBIN: 14.1 g/dL (ref 12.0–15.0)
LYMPHS ABS: 2.6 10*3/uL (ref 0.7–4.0)
Lymphocytes Relative: 22 % (ref 12–46)
MCH: 28.8 pg (ref 26.0–34.0)
MCHC: 31.6 g/dL (ref 30.0–36.0)
MCV: 91.2 fL (ref 78.0–100.0)
MONO ABS: 0.7 10*3/uL (ref 0.1–1.0)
Monocytes Relative: 6 % (ref 3–12)
Neutro Abs: 8.6 10*3/uL — ABNORMAL HIGH (ref 1.7–7.7)
Neutrophils Relative %: 71 % (ref 43–77)
Platelets: 389 10*3/uL (ref 150–400)
RBC: 4.89 MIL/uL (ref 3.87–5.11)
RDW: 13.9 % (ref 11.5–15.5)
WBC: 12 10*3/uL — AB (ref 4.0–10.5)

## 2014-09-09 LAB — URINALYSIS, ROUTINE W REFLEX MICROSCOPIC
Bilirubin Urine: NEGATIVE
GLUCOSE, UA: NEGATIVE mg/dL
Hgb urine dipstick: NEGATIVE
KETONES UR: 15 mg/dL — AB
Leukocytes, UA: NEGATIVE
Nitrite: NEGATIVE
PROTEIN: NEGATIVE mg/dL
Specific Gravity, Urine: 1.007 (ref 1.005–1.030)
UROBILINOGEN UA: 0.2 mg/dL (ref 0.0–1.0)
pH: 7.5 (ref 5.0–8.0)

## 2014-09-09 MED ORDER — ONDANSETRON HCL 4 MG/2ML IJ SOLN
4.0000 mg | Freq: Once | INTRAMUSCULAR | Status: AC
  Administered 2014-09-09: 4 mg via INTRAVENOUS
  Filled 2014-09-09: qty 2

## 2014-09-09 MED ORDER — SODIUM CHLORIDE 0.9 % IV BOLUS (SEPSIS)
1000.0000 mL | Freq: Once | INTRAVENOUS | Status: AC
  Administered 2014-09-09: 1000 mL via INTRAVENOUS

## 2014-09-09 MED ORDER — MORPHINE SULFATE 2 MG/ML IJ SOLN
2.0000 mg | INTRAMUSCULAR | Status: DC | PRN
  Administered 2014-09-10: 2 mg via INTRAVENOUS
  Administered 2014-09-10: 4 mg via INTRAVENOUS
  Administered 2014-09-10 (×2): 2 mg via INTRAVENOUS
  Filled 2014-09-09: qty 2
  Filled 2014-09-09 (×2): qty 1
  Filled 2014-09-09: qty 2
  Filled 2014-09-09: qty 1

## 2014-09-09 MED ORDER — ONDANSETRON HCL 4 MG/2ML IJ SOLN
4.0000 mg | INTRAMUSCULAR | Status: DC | PRN
  Administered 2014-09-10 (×3): 4 mg via INTRAVENOUS
  Filled 2014-09-09 (×3): qty 2

## 2014-09-09 MED ORDER — PANTOPRAZOLE SODIUM 40 MG IV SOLR
40.0000 mg | Freq: Every day | INTRAVENOUS | Status: DC
  Administered 2014-09-10: 40 mg via INTRAVENOUS
  Filled 2014-09-09 (×2): qty 40

## 2014-09-09 MED ORDER — POTASSIUM CHLORIDE IN NACL 20-0.9 MEQ/L-% IV SOLN
INTRAVENOUS | Status: DC
  Administered 2014-09-10: 100 mL via INTRAVENOUS
  Filled 2014-09-09 (×3): qty 1000

## 2014-09-09 MED ORDER — MORPHINE SULFATE 4 MG/ML IJ SOLN
4.0000 mg | Freq: Once | INTRAMUSCULAR | Status: AC
  Administered 2014-09-09: 4 mg via INTRAVENOUS
  Filled 2014-09-09: qty 1

## 2014-09-09 NOTE — ED Provider Notes (Signed)
CSN: [id number]     Arrival date & time 09/09/14  1845 History   First MD Initiated Contact with Patient 09/09/14 2026     Chief Complaint  Patient presents with  . Abdominal Pain     (Consider location/radiation/quality/duration/timing/severity/associated sxs/prior Treatment) HPI   69 year old female with history of ulcerative colitis, autoimmune hemolytic anemia and past surgical history including splenectomy, laparotomy and tonsillectomy who presents for evaluation of abdominal pain. Patient reports since last night she has had gradual onset of sharp pain throughout her right side of abdomen. Pain is worsened with sitting, rate as an 8 out of 10, and pain is getting progressively worse. She endorses mild nausea without vomiting. She has a normal bowel movement today. No associated fever, chills, chest pain, shortness of breath, productive cough, back pain, dysuria, hematuria, vaginal bleeding, hematochezia or melena. She is able to pass flatus. Patient states that her ulcer colitis has not flared for many years. She did take a Vicodin today with minimal relief.  Past Medical History  Diagnosis Date  . Ulcerative colitis   . Autoimmune hemolytic anemia   . Right tibial fracture   . Left wrist fracture    Past Surgical History  Procedure Laterality Date  . Splenectomy    . Laparotomy    . Tonsillectomy     History reviewed. No pertinent family history. History  Substance Use Topics  . Smoking status: Never Smoker   . Smokeless tobacco: Not on file  . Alcohol Use: No   OB History    No data available     Review of Systems  All other systems reviewed and are negative.     Allergies  Iodinated diagnostic agents; Aminoglycosides; Corn-containing products; Gluten meal; Lactose intolerance (gi); Rice; and Soy allergy  Home Medications   Prior to Admission medications   Not on File   BP 135/83 mmHg  Pulse 78  Temp(Src) 98.2 F (36.8 C) (Oral)  Resp 18  SpO2  99% Physical Exam  Constitutional: She appears well-developed and well-nourished. No distress.  HENT:  Head: Atraumatic.  Eyes: Conjunctivae are normal.  Neck: Neck supple.  Cardiovascular: Normal rate and regular rhythm.   Pulmonary/Chest: Effort normal and breath sounds normal.  Abdominal: Soft. She exhibits no distension. There is tenderness (Diffuse tenderness most significant to right lower quadrant and epigastric with guarding but without rebound tenderness. No peritoneal sign.). There is guarding. There is no rebound.  Neurological: She is alert.  Skin: No rash noted.  Psychiatric: She has a normal mood and affect.  Nursing note and vitals reviewed.   ED Course  Procedures (including critical care time)  Patient here with abdominal pain. Pain is reproducible on exam. Patient will need a abdominal and pelvis CT scan for further evaluation. Pain medication and antinausea medication given. She is afebrile with stable normal vital signs.  10:04 PM There is a leukocytosis with a WBC of 12. The remainder of her labs are unremarkable. CT scan demonstrate an appendix that is enlarged without any significant inflammation however this finding is concerning for early appendicitis. Given that she has RLQ abd pain with this finding, i am concern that she may have early appendicitis.  I have consulted with [facility] Dr. Abbey Chatters who agrees to see and admit pt for further care.  Care discussed with Dr. Elesa Massed.    Labs Review Labs Reviewed  CBC WITH DIFFERENTIAL/PLATELET - Abnormal; Notable for the following:    WBC 12.0 (*)    Neutro Abs 8.6 (*)  All other components within normal limits  COMPREHENSIVE METABOLIC PANEL - Abnormal; Notable for the following:    GFR calc non Af Amer 86 (*)    All other components within normal limits  URINALYSIS, ROUTINE W REFLEX MICROSCOPIC - Abnormal; Notable for the following:    Ketones, ur 15 (*)    All other components within normal limits  LIPASE, BLOOD     Imaging Review Ct Abdomen Pelvis Wo Contrast  09/09/2014   CLINICAL DATA:  Abdominal pain, nausea, history ulcer colitis  EXAM: CT ABDOMEN AND PELVIS WITHOUT CONTRAST  TECHNIQUE: Multidetector CT imaging of the abdomen and pelvis was performed following the standard protocol without IV contrast.  COMPARISON:  CT 10/12/2012  FINDINGS: Lower chest:  Lung bases are clear.  Hepatobiliary: No focal hepatic lesion. Gallbladder is distended to 3.5 cm. No gallbladder inflammation.  Pancreas: Pancreas is normal. No ductal dilatation. No pancreatic inflammation. There is a prominent pancreatic accessory duct which could indicate pancreas divisum.  Spleen: Prior splenectomy. Several small is splenule is left upper quadrant.  Adrenals/urinary tract: Adrenal glands are normal. Kidneys, ureters and bladder normal. The right ureter is mildly dilated but there is no obstructing calculus.  Stomach/Bowel: Stomach, small bowel, and cecum are normal. The appendix is dilated to 11 mm in the mid appendix and up to 11 mm the tail (image 49 and 45). Appendix is fluid-filled. There is no periappendiceal inflammation. Towards the orifice of the appendix there is mild hyperemia seen on sagittal image 34 (series 6). The appendix extends medial from the cecum and turns cephalad along the psoas muscle.  Colon and rectosigmoid colon are normal. No evidence of colonic inflammation or obstruction. There is a periampullary duodenum diverticulum along the C-loop of the duodenum measuring 2 cm (image 27, series 2.  Vascular/Lymphatic: Dull aorta is mildly calcified. No retroperitoneal periportal lymphadenopathy.  Reproductive: Uterus and ovaries are normal.  Musculoskeletal: No aggressive osseous lesion.  Other: No free fluid in the abdomen or pelvis.  IMPRESSION: 1. Appendix is mildly hyperemic at the base and dilated at the tip. While there is no periappendiceal inflammation, findings are concerning for early appendicitis. 2. Mildly dilated  right ureter without obstructing lesion identified. No ureteral calculus or bladder calculi. 3. Incidental note of the duodenum diverticulum. Findings conveyed toBOWIE Taequan Stockhausen on 09/09/2014  at22:05.   Electronically Signed   By: Genevive BiStewart  Edmunds M.D.   On: 09/09/2014 22:05     EKG Interpretation None      MDM   Final diagnoses:  Abdominal pain  Acute appendicitis, unspecified acute appendicitis type    BP 99/65 mmHg  Pulse 65  Temp(Src) 98.2 F (36.8 C) (Oral)  Resp 18  SpO2 96%  I have reviewed nursing notes and vital signs. I personally reviewed the imaging tests through PACS system  I reviewed available ER/hospitalization records thought the EMR     Fayrene HelperBowie Dalen [place], [place] JerseyPA-C 09/09/14 2353

## 2014-09-09 NOTE — H&P (Signed)
Joan [name] is an 69 y.o. female.   Chief Complaint: Worsening abdominal pain HPI:   This is a 69 year old female with a history of ulcerative colitis that has been quiescent. For the past few months she has had intermittent sharp pains in the upper abdomen and right lower quadrant area. These are self-limited. She woke this morning at approximately 2:30 with gradually increasing pain and they'll bring her epigastrium and radiating to the left upper quadrant, right upper quadrant, and right lower quadrant. The pains are described as sharp in the intensity progressively worsened. No fever or chills. Mild nausea but no vomiting. No dysuria or hematuria. No diarrhea. She presented to the emergency department and was noted to have a mild leukocytosis. A CT scan was performed. This demonstrated an appendix that was dilated at 11 mm in size without any periappendiceal inflammatory changes. The appendix was fluid-filled. There was concern for the possibility of early acute appendicitis and I was asked to see her. She's been given some morphine and now her pain is significantly better.  Past Medical History  Diagnosis Date  . Ulcerative colitis   . Autoimmune hemolytic anemia   . Right tibial fracture   . Left wrist fracture     Past Surgical History  Procedure Laterality Date  . Splenectomy    . Laparotomy    . Tonsillectomy      History reviewed. No pertinent family history. Social History:  reports that she has never smoked. She does not have any smokeless tobacco history on file. She reports that she does not drink alcohol or use illicit drugs.  Allergies:  Allergies  Allergen Reactions  . Iodinated Diagnostic Agents Other (See Comments)    Allergy described as rapid increase in heart rate//a.c.  . Aminoglycosides   . Corn-Containing Products   . Gluten Meal   . Lactose Intolerance (Gi)   . Rice   . Soy Allergy      (Not in a hospital admission)  Results for orders placed or  performed during the hospital encounter of 09/09/14 (from the past 48 hour(s))  CBC with Differential     Status: Abnormal   Collection Time: 09/09/14  7:25 PM  Result Value Ref Range   WBC 12.0 (H) 4.0 - 10.5 K/uL   RBC 4.89 3.87 - 5.11 MIL/uL   Hemoglobin 14.1 12.0 - 15.0 g/dL   HCT 44.6 36.0 - 46.0 %   MCV 91.2 78.0 - 100.0 fL   MCH 28.8 26.0 - 34.0 pg   MCHC 31.6 30.0 - 36.0 g/dL   RDW 13.9 11.5 - 15.5 %   Platelets 389 150 - 400 K/uL   Neutrophils Relative % 71 43 - 77 %   Neutro Abs 8.6 (H) 1.7 - 7.7 K/uL   Lymphocytes Relative 22 12 - 46 %   Lymphs Abs 2.6 0.7 - 4.0 K/uL   Monocytes Relative 6 3 - 12 %   Monocytes Absolute 0.7 0.1 - 1.0 K/uL   Eosinophils Relative 1 0 - 5 %   Eosinophils Absolute 0.1 0.0 - 0.7 K/uL   Basophils Relative 0 0 - 1 %   Basophils Absolute 0.0 0.0 - 0.1 K/uL  Comprehensive metabolic panel     Status: Abnormal   Collection Time: 09/09/14  7:25 PM  Result Value Ref Range   Sodium 137 135 - 145 mmol/L   Potassium 4.0 3.5 - 5.1 mmol/L   Chloride 103 96 - 112 mmol/L   CO2 24 19 -  32 mmol/L   Glucose, Bld 87 70 - 99 mg/dL   BUN 19 6 - 23 mg/dL   Creatinine, Ser 0.71 0.50 - 1.10 mg/dL   Calcium 9.6 8.4 - 10.5 mg/dL   Total Protein 7.9 6.0 - 8.3 g/dL   Albumin 4.4 3.5 - 5.2 g/dL   AST 32 0 - 37 U/L   ALT 21 0 - 35 U/L   Alkaline Phosphatase 82 39 - 117 U/L   Total Bilirubin 1.1 0.3 - 1.2 mg/dL   GFR calc non Af Amer 86 (L) >90 mL/min   GFR calc Af Amer >90 >90 mL/min    Comment: (NOTE) The eGFR has been calculated using the CKD EPI equation. This calculation has not been validated in all clinical situations. eGFR's persistently <90 mL/min signify possible Chronic Kidney Disease.    Anion gap 10 5 - 15  Lipase, blood     Status: None   Collection Time: 09/09/14  7:25 PM  Result Value Ref Range   Lipase 35 11 - 59 U/L  Urinalysis, Routine w reflex microscopic     Status: Abnormal   Collection Time: 09/09/14  7:34 PM  Result Value Ref  Range   Color, Urine YELLOW YELLOW   APPearance CLEAR CLEAR   Specific Gravity, Urine 1.007 1.005 - 1.030   pH 7.5 5.0 - 8.0   Glucose, UA NEGATIVE NEGATIVE mg/dL   Hgb urine dipstick NEGATIVE NEGATIVE   Bilirubin Urine NEGATIVE NEGATIVE   Ketones, ur 15 (A) NEGATIVE mg/dL   Protein, ur NEGATIVE NEGATIVE mg/dL   Urobilinogen, UA 0.2 0.0 - 1.0 mg/dL   Nitrite NEGATIVE NEGATIVE   Leukocytes, UA NEGATIVE NEGATIVE    Comment: MICROSCOPIC NOT DONE ON URINES WITH NEGATIVE PROTEIN, BLOOD, LEUKOCYTES, NITRITE, OR GLUCOSE <1000 mg/dL.   Ct Abdomen Pelvis Wo Contrast  09/09/2014   CLINICAL DATA:  Abdominal pain, nausea, history ulcer colitis  EXAM: CT ABDOMEN AND PELVIS WITHOUT CONTRAST  TECHNIQUE: Multidetector CT imaging of the abdomen and pelvis was performed following the standard protocol without IV contrast.  COMPARISON:  CT 10/12/2012  FINDINGS: Lower chest:  Lung bases are clear.  Hepatobiliary: No focal hepatic lesion. Gallbladder is distended to 3.5 cm. No gallbladder inflammation.  Pancreas: Pancreas is normal. No ductal dilatation. No pancreatic inflammation. There is a prominent pancreatic accessory duct which could indicate pancreas divisum.  Spleen: Prior splenectomy. Several small is splenule is left upper quadrant.  Adrenals/urinary tract: Adrenal glands are normal. Kidneys, ureters and bladder normal. The right ureter is mildly dilated but there is no obstructing calculus.  Stomach/Bowel: Stomach, small bowel, and cecum are normal. The appendix is dilated to 11 mm in the mid appendix and up to 11 mm the tail (image 49 and 45). Appendix is fluid-filled. There is no periappendiceal inflammation. Towards the orifice of the appendix there is mild hyperemia seen on sagittal image 34 (series 6). The appendix extends medial from the cecum and turns cephalad along the psoas muscle.  Colon and rectosigmoid colon are normal. No evidence of colonic inflammation or obstruction. There is a periampullary  duodenum diverticulum along the C-loop of the duodenum measuring 2 cm (image 27, series 2.  Vascular/Lymphatic: Dull aorta is mildly calcified. No retroperitoneal periportal lymphadenopathy.  Reproductive: Uterus and ovaries are normal.  Musculoskeletal: No aggressive osseous lesion.  Other: No free fluid in the abdomen or pelvis.  IMPRESSION: 1. Appendix is mildly hyperemic at the base and dilated at the tip. While there is no periappendiceal  inflammation, findings are concerning for early appendicitis. 2. Mildly dilated right ureter without obstructing lesion identified. No ureteral calculus or bladder calculi. 3. Incidental note of the duodenum diverticulum. Findings conveyed toBOWIE TRAN on 09/09/2014  at22:05.   Electronically Signed   By: Suzy Bouchard M.D.   On: 09/09/2014 22:05    Review of Systems  Constitutional: Negative for fever and chills.  Gastrointestinal: Positive for nausea and abdominal pain. Negative for vomiting and diarrhea.  Genitourinary: Negative for dysuria and hematuria.  Endo/Heme/Allergies:       No blood clots    Blood pressure 99/65, pulse 65, temperature 98.2 F (36.8 C), temperature source Oral, resp. rate 18, SpO2 96 %. Physical Exam  Constitutional: She appears well-developed and well-nourished. No distress.  Eyes: EOM are normal. No scleral icterus.  Neck: Neck supple.  Cardiovascular: Normal rate and regular rhythm.   Respiratory: Effort normal and breath sounds normal.  GI: Soft. She exhibits no distension and no mass. There is tenderness (mild tenderness to palpation in  the epigastric region, right upper quadrant region, and moderate tenderness in the right lower quadrant region.). There is no guarding.  There is a left upper quadrant scar. There is a lower transverse scar.  Musculoskeletal: She exhibits no edema.  Lymphadenopathy:    She has no cervical adenopathy.  Neurological: She is alert.  Skin: Skin is warm and dry.  Psychiatric: She has a  normal mood and affect. Her behavior is normal.     Assessment/Plan Diffuse abdominal pain with sparing the left lower quadrant. She has mild tenderness in the epigastric area, right upper quadrant, and moderate tenderness in the right lower quadrant. CT scan is concerning for possible early acute appendicitis. She is not acutely ill and is very comfortable at this time.  She has no involuntary guarding or signs of peritonitis.  Plan: Admit to the hospital. Empirically start IV antibiotics. Hydration and bowel rest. Repeat CBC and exam in the morning. I explained to her that we may recommend a laparoscopic appendectomy and she and her husband understand this.  Axie Hayne J 09/09/2014, 11:34 PM

## 2014-09-09 NOTE — ED Notes (Signed)
Pt c/o abdominal pain, nausea, onset today.

## 2014-09-09 NOTE — ED Provider Notes (Signed)
Medical screening examination/treatment/procedure(s) were conducted as a shared visit with non-physician practitioner(s) and myself.  I personally evaluated the patient during the encounter.   EKG Interpretation None      Pt is a 69 y.o. female with history of ulcerative colitis who presents to the emergency room with right lower quadrant pain. Patient has leukocytosis with left shift. CT scan shows possible early appendicitis. Surgery consulted.  Layla MawKristen N Jaden Abreu, DO 09/09/14 2219

## 2014-09-10 ENCOUNTER — Inpatient Hospital Stay (HOSPITAL_COMMUNITY): Payer: Medicare Other | Admitting: Anesthesiology

## 2014-09-10 ENCOUNTER — Encounter (HOSPITAL_COMMUNITY): Payer: Self-pay | Admitting: Registered Nurse

## 2014-09-10 ENCOUNTER — Encounter (HOSPITAL_COMMUNITY)
Admission: EM | Disposition: A | Discharge status: Home / Self Care | Payer: Self-pay | Source: Home / Self Care | Attending: Emergency Medicine

## 2014-09-10 DIAGNOSIS — K3580 Unspecified acute appendicitis: Secondary | ICD-10-CM | POA: Diagnosis not present

## 2014-09-10 DIAGNOSIS — K5190 Ulcerative colitis, unspecified, without complications: Secondary | ICD-10-CM | POA: Diagnosis not present

## 2014-09-10 DIAGNOSIS — D591 Other autoimmune hemolytic anemias: Secondary | ICD-10-CM | POA: Diagnosis not present

## 2014-09-10 HISTORY — PX: LAPAROSCOPIC APPENDECTOMY: SHX408

## 2014-09-10 LAB — CBC
HEMATOCRIT: 40.5 % (ref 36.0–46.0)
Hemoglobin: 12.5 g/dL (ref 12.0–15.0)
MCH: 28.5 pg (ref 26.0–34.0)
MCHC: 30.9 g/dL (ref 30.0–36.0)
MCV: 92.5 fL (ref 78.0–100.0)
Platelets: 376 10*3/uL (ref 150–400)
RBC: 4.38 MIL/uL (ref 3.87–5.11)
RDW: 14.1 % (ref 11.5–15.5)
WBC: 11.1 10*3/uL — ABNORMAL HIGH (ref 4.0–10.5)

## 2014-09-10 LAB — SURGICAL PCR SCREEN
MRSA, PCR: NEGATIVE
Staphylococcus aureus: NEGATIVE

## 2014-09-10 SURGERY — APPENDECTOMY, LAPAROSCOPIC
Anesthesia: General | Site: Abdomen

## 2014-09-10 MED ORDER — 0.9 % SODIUM CHLORIDE (POUR BTL) OPTIME
TOPICAL | Status: DC | PRN
  Administered 2014-09-10: 1000 mL

## 2014-09-10 MED ORDER — METOCLOPRAMIDE HCL 5 MG/ML IJ SOLN
INTRAMUSCULAR | Status: AC
  Filled 2014-09-10: qty 2

## 2014-09-10 MED ORDER — GLYCOPYRROLATE 0.2 MG/ML IJ SOLN
INTRAMUSCULAR | Status: DC | PRN
  Administered 2014-09-10: 0.3 mg via INTRAVENOUS

## 2014-09-10 MED ORDER — ROCURONIUM BROMIDE 100 MG/10ML IV SOLN
INTRAVENOUS | Status: AC
  Filled 2014-09-10: qty 1

## 2014-09-10 MED ORDER — PROPOFOL 10 MG/ML IV BOLUS
INTRAVENOUS | Status: AC
  Filled 2014-09-10: qty 20

## 2014-09-10 MED ORDER — LACTATED RINGERS IR SOLN
Status: DC | PRN
  Administered 2014-09-10: 1000 mL

## 2014-09-10 MED ORDER — NEOSTIGMINE METHYLSULFATE 10 MG/10ML IV SOLN
INTRAVENOUS | Status: DC | PRN
  Administered 2014-09-10: .5 mg via INTRAVENOUS

## 2014-09-10 MED ORDER — ONDANSETRON HCL 4 MG/2ML IJ SOLN
INTRAMUSCULAR | Status: AC
  Filled 2014-09-10: qty 2

## 2014-09-10 MED ORDER — KCL IN DEXTROSE-NACL 20-5-0.45 MEQ/L-%-% IV SOLN
INTRAVENOUS | Status: DC
  Administered 2014-09-10: 21:00:00 via INTRAVENOUS
  Filled 2014-09-10: qty 1000

## 2014-09-10 MED ORDER — FENTANYL CITRATE 0.05 MG/ML IJ SOLN
INTRAMUSCULAR | Status: AC
  Filled 2014-09-10: qty 2

## 2014-09-10 MED ORDER — ONDANSETRON HCL 4 MG/2ML IJ SOLN
INTRAMUSCULAR | Status: DC | PRN
  Administered 2014-09-10: 4 mg via INTRAVENOUS

## 2014-09-10 MED ORDER — MIDAZOLAM HCL 2 MG/2ML IJ SOLN
INTRAMUSCULAR | Status: AC
  Filled 2014-09-10: qty 2

## 2014-09-10 MED ORDER — NEOSTIGMINE METHYLSULFATE 10 MG/10ML IV SOLN
INTRAVENOUS | Status: AC
  Filled 2014-09-10: qty 1

## 2014-09-10 MED ORDER — CEFTRIAXONE SODIUM IN DEXTROSE 20 MG/ML IV SOLN
1.0000 g | Freq: Every day | INTRAVENOUS | Status: DC
  Administered 2014-09-10: 1 g via INTRAVENOUS
  Filled 2014-09-10 (×3): qty 50

## 2014-09-10 MED ORDER — LIDOCAINE HCL (CARDIAC) 20 MG/ML IV SOLN
INTRAVENOUS | Status: DC | PRN
  Administered 2014-09-10: 25 mg via INTRATRACHEAL
  Administered 2014-09-10: 75 mg via INTRAVENOUS

## 2014-09-10 MED ORDER — METOCLOPRAMIDE HCL 5 MG/ML IJ SOLN
INTRAMUSCULAR | Status: DC | PRN
  Administered 2014-09-10: 10 mg via INTRAVENOUS

## 2014-09-10 MED ORDER — GLYCOPYRROLATE 0.2 MG/ML IJ SOLN
INTRAMUSCULAR | Status: AC
  Filled 2014-09-10: qty 2

## 2014-09-10 MED ORDER — LIDOCAINE HCL (CARDIAC) 20 MG/ML IV SOLN
INTRAVENOUS | Status: AC
  Filled 2014-09-10: qty 5

## 2014-09-10 MED ORDER — PROPOFOL 10 MG/ML IV BOLUS
INTRAVENOUS | Status: DC | PRN
  Administered 2014-09-10: 40 mg via INTRAVENOUS
  Administered 2014-09-10: 160 mg via INTRAVENOUS

## 2014-09-10 MED ORDER — CEFTRIAXONE SODIUM IN DEXTROSE 20 MG/ML IV SOLN
1.0000 g | Freq: Two times a day (BID) | INTRAVENOUS | Status: DC
  Administered 2014-09-10 – 2014-09-11 (×2): 1 g via INTRAVENOUS
  Filled 2014-09-10 (×3): qty 50

## 2014-09-10 MED ORDER — DEXAMETHASONE SODIUM PHOSPHATE 10 MG/ML IJ SOLN
INTRAMUSCULAR | Status: DC | PRN
  Administered 2014-09-10: 10 mg via INTRAVENOUS

## 2014-09-10 MED ORDER — ROCURONIUM BROMIDE 100 MG/10ML IV SOLN
INTRAVENOUS | Status: DC | PRN
  Administered 2014-09-10: 20 mg via INTRAVENOUS
  Administered 2014-09-10: 5 mg via INTRAVENOUS

## 2014-09-10 MED ORDER — METRONIDAZOLE IN NACL 5-0.79 MG/ML-% IV SOLN
500.0000 mg | Freq: Three times a day (TID) | INTRAVENOUS | Status: DC
  Administered 2014-09-10 (×3): 500 mg via INTRAVENOUS
  Filled 2014-09-10 (×4): qty 100

## 2014-09-10 MED ORDER — BUPIVACAINE-EPINEPHRINE (PF) 0.25% -1:200000 IJ SOLN
INTRAMUSCULAR | Status: AC
  Filled 2014-09-10: qty 30

## 2014-09-10 MED ORDER — MORPHINE SULFATE 2 MG/ML IJ SOLN: 1.0000 mg | INTRAMUSCULAR | Status: DC | PRN

## 2014-09-10 MED ORDER — ACETAMINOPHEN 10 MG/ML IV SOLN
INTRAVENOUS | Status: DC | PRN
  Administered 2014-09-10: 1000 mg via INTRAVENOUS

## 2014-09-10 MED ORDER — FENTANYL CITRATE 0.05 MG/ML IJ SOLN
INTRAMUSCULAR | Status: AC
  Filled 2014-09-10: qty 5

## 2014-09-10 MED ORDER — IBUPROFEN 200 MG PO TABS: 600.0000 mg | ORAL_TABLET | Freq: Four times a day (QID) | ORAL | Status: DC | PRN

## 2014-09-10 MED ORDER — ONDANSETRON HCL 4 MG PO TABS: 4.0000 mg | ORAL_TABLET | Freq: Four times a day (QID) | ORAL | Status: DC | PRN

## 2014-09-10 MED ORDER — DEXAMETHASONE SODIUM PHOSPHATE 10 MG/ML IJ SOLN
INTRAMUSCULAR | Status: AC
  Filled 2014-09-10: qty 1

## 2014-09-10 MED ORDER — BUPIVACAINE-EPINEPHRINE 0.25% -1:200000 IJ SOLN
INTRAMUSCULAR | Status: DC | PRN
  Administered 2014-09-10: 20 mL

## 2014-09-10 MED ORDER — FENTANYL CITRATE 0.05 MG/ML IJ SOLN
INTRAMUSCULAR | Status: DC | PRN
  Administered 2014-09-10: 25 ug via INTRAVENOUS
  Administered 2014-09-10: 100 ug via INTRAVENOUS

## 2014-09-10 MED ORDER — ONDANSETRON HCL 4 MG/2ML IJ SOLN: 4.0000 mg | Freq: Four times a day (QID) | INTRAMUSCULAR | Status: DC | PRN

## 2014-09-10 MED ORDER — MORPHINE SULFATE 4 MG/ML IJ SOLN
4.0000 mg | Freq: Once | INTRAMUSCULAR | Status: AC
  Administered 2014-09-10: 4 mg via INTRAVENOUS

## 2014-09-10 MED ORDER — HYDROCODONE-ACETAMINOPHEN 5-325 MG PO TABS
1.0000 | ORAL_TABLET | ORAL | Status: DC | PRN
  Administered 2014-09-10 (×2): 1 via ORAL
  Administered 2014-09-11 (×2): 2 via ORAL
  Filled 2014-09-10 (×2): qty 2
  Filled 2014-09-10 (×2): qty 1

## 2014-09-10 MED ORDER — FENTANYL CITRATE 0.05 MG/ML IJ SOLN
25.0000 ug | INTRAMUSCULAR | Status: DC | PRN
  Administered 2014-09-10 (×2): 25 ug via INTRAVENOUS

## 2014-09-10 MED ORDER — SUCCINYLCHOLINE CHLORIDE 20 MG/ML IJ SOLN
INTRAMUSCULAR | Status: DC | PRN
  Administered 2014-09-10: 80 mg via INTRAVENOUS

## 2014-09-10 MED ORDER — MIDAZOLAM HCL 5 MG/5ML IJ SOLN
INTRAMUSCULAR | Status: DC | PRN
Start: 2014-09-10 — End: 2014-09-10
  Administered 2014-09-10: 1 mg via INTRAVENOUS

## 2014-09-10 MED ORDER — LACTATED RINGERS IV SOLN
INTRAVENOUS | Status: DC
  Administered 2014-09-10: 1000 mL via INTRAVENOUS
  Administered 2014-09-10: 17:00:00 via INTRAVENOUS

## 2014-09-10 MED ORDER — ACETAMINOPHEN 10 MG/ML IV SOLN
1000.0000 mg | Freq: Once | INTRAVENOUS | Status: DC
  Filled 2014-09-10: qty 100

## 2014-09-10 SURGICAL SUPPLY — 40 items
APL SKNCLS STERI-STRIP NONHPOA (GAUZE/BANDAGES/DRESSINGS)
APPLIER CLIP ROT 10 11.4 M/L (STAPLE)
APR CLP MED LRG 11.4X10 (STAPLE)
BAG SPEC RTRVL LRG 6X4 10 (ENDOMECHANICALS) ×1
BENZOIN TINCTURE PRP APPL 2/3 (GAUZE/BANDAGES/DRESSINGS) ×1 IMPLANT
CLIP APPLIE ROT 10 11.4 M/L (STAPLE) IMPLANT
CLOSURE WOUND 1/2 X4 (GAUZE/BANDAGES/DRESSINGS)
CUTTER FLEX LINEAR 45M (STAPLE) ×2 IMPLANT
DECANTER SPIKE VIAL GLASS SM (MISCELLANEOUS) ×1 IMPLANT
DRAPE LAPAROSCOPIC ABDOMINAL (DRAPES) ×3 IMPLANT
ELECT REM PT RETURN 9FT ADLT (ELECTROSURGICAL) ×3
ELECTRODE REM PT RTRN 9FT ADLT (ELECTROSURGICAL) ×1 IMPLANT
ENDOLOOP SUT PDS II  0 18 (SUTURE)
ENDOLOOP SUT PDS II 0 18 (SUTURE) IMPLANT
GLOVE BIOGEL PI IND STRL 7.0 (GLOVE) ×1 IMPLANT
GLOVE BIOGEL PI INDICATOR 7.0 (GLOVE) ×2
GLOVE SURG ORTHO 8.0 STRL STRW (GLOVE) ×3 IMPLANT
GOWN STRL REUS W/TWL LRG LVL3 (GOWN DISPOSABLE) ×3 IMPLANT
GOWN STRL REUS W/TWL XL LVL3 (GOWN DISPOSABLE) ×6 IMPLANT
KIT BASIN OR (CUSTOM PROCEDURE TRAY) ×3 IMPLANT
LIQUID BAND (GAUZE/BANDAGES/DRESSINGS) ×2 IMPLANT
PENCIL BUTTON HOLSTER BLD 10FT (ELECTRODE) IMPLANT
POUCH SPECIMEN RETRIEVAL 10MM (ENDOMECHANICALS) ×2 IMPLANT
RELOAD 45 VASCULAR/THIN (ENDOMECHANICALS) IMPLANT
RELOAD STAPLE 45 2.5 WHT GRN (ENDOMECHANICALS) IMPLANT
RELOAD STAPLE 45 3.5 BLU ETS (ENDOMECHANICALS) IMPLANT
RELOAD STAPLE TA45 3.5 REG BLU (ENDOMECHANICALS) ×3 IMPLANT
SET IRRIG TUBING LAPAROSCOPIC (IRRIGATION / IRRIGATOR) ×2 IMPLANT
SHEARS HARMONIC ACE PLUS 36CM (ENDOMECHANICALS) ×2 IMPLANT
SOLUTION ANTI FOG 6CC (MISCELLANEOUS) ×1 IMPLANT
STRIP CLOSURE SKIN 1/2X4 (GAUZE/BANDAGES/DRESSINGS) ×1 IMPLANT
SUT MNCRL AB 4-0 PS2 18 (SUTURE) ×3 IMPLANT
TOWEL OR 17X26 10 PK STRL BLUE (TOWEL DISPOSABLE) ×3 IMPLANT
TRAY FOLEY CATH 14FRSI W/METER (CATHETERS) ×3 IMPLANT
TRAY LAPAROSCOPIC (CUSTOM PROCEDURE TRAY) ×3 IMPLANT
TROCAR BLADELESS OPT 5 75 (ENDOMECHANICALS) ×3 IMPLANT
TROCAR XCEL BLUNT TIP 100MML (ENDOMECHANICALS) ×3 IMPLANT
TROCAR XCEL NON-BLD 11X100MML (ENDOMECHANICALS) ×3 IMPLANT
TUBING INSUFFLATION 10FT LAP (TUBING) ×1 IMPLANT
WATER STERILE IRR 1500ML POUR (IV SOLUTION) ×1 IMPLANT

## 2014-09-10 NOTE — Anesthesia Preprocedure Evaluation (Addendum)
Anesthesia Evaluation  Patient identified by MRN, date of birth, ID band Patient awake    Reviewed: Allergy & Precautions, NPO status , Patient's Chart, lab work & pertinent test results  Airway Mallampati: II  TM Distance: >3 FB Neck ROM: Full    Dental no notable dental hx.    Pulmonary neg pulmonary ROS,  breath sounds clear to auscultation  Pulmonary exam normal       Cardiovascular negative cardio ROS  Rhythm:Regular Rate:Normal     Neuro/Psych negative neurological ROS  negative psych ROS   GI/Hepatic Neg liver ROS, PUD,   Endo/Other  negative endocrine ROS  Renal/GU negative Renal ROS     Musculoskeletal negative musculoskeletal ROS (+)   Abdominal   Peds  Hematology negative hematology ROS (+) anemia ,   Anesthesia Other Findings   Reproductive/Obstetrics negative OB ROS                             Anesthesia Physical Anesthesia Plan  ASA: II  Anesthesia Plan: General   Post-op Pain Management:    Induction: Intravenous and Rapid sequence  Airway Management Planned: Oral ETT  Additional Equipment: None  Intra-op Plan:   Post-operative Plan: Extubation in OR  Informed Consent: I have reviewed the patients History and Physical, chart, labs and discussed the procedure including the risks, benefits and alternatives for the proposed anesthesia with the patient or authorized representative who has indicated his/her understanding and acceptance.   Dental advisory given  Plan Discussed with: CRNA  Anesthesia Plan Comments:         Anesthesia Quick Evaluation

## 2014-09-10 NOTE — Op Note (Signed)
OPERATIVE REPORT - LAPAROSCOPIC APPENDECTOMY  Preop diagnosis: Acute appendicitis  Postop diagnosis: Same  Procedure: Laparoscopic appendectomy  Surgeon:  Velora Hecklerodd M. Deidrick Rainey, MD, FACS  Anesthesia: General endotracheal  Estimated blood loss: Minimal  Preparation: Chlora-prep  Complications: None  Indications:  Patient is a 69 yo WF with diffuse abdominal pain localizing to the RLQ.  WBC 12K.  CT scan suspicious for appendicitis.  Procedure:  Patient is brought to the operating room and placed in a supine position on the operating room table. Following administration of general anesthesia, a time out was held and the patient's name and procedure is confirmed. Patient is then prepped and draped in the usual strict aseptic fashion.  After ascertaining that an adequate level of anesthesia has been achieved, a peri-umbilical incision is made with a #15 blade. Dissection is carried down to the fascia. Fascia is incised in the midline and the peritoneal cavity is entered cautiously. A #0-vicryl pursestring suture is placed in the fascia. An Hassan cannula is introduced under direct vision and secured with the pursestring suture. The abdomen is insufflated with carbon dioxide. The laparoscope is introduced and the abdomen is explored. Operative ports are placed in the right upper quadrant and left lower quadrant. The appendix is identified. The mesoappendix is divided with the harmonic scalpel. Dissection is carried down to the base of the appendix. The base of the appendix is dissected out clearing the junction with the cecal wall. Using an Endo-GIA stapler, the base of the appendix is transected at the junction with the cecal wall. There is good approximation of tissue along the staple line. There is good hemostasis along the staple line. The appendix is placed into an endo-catch bag and withdrawn through the umbilical port. The #0-vicryl pursestring suture is tied securely.  Right lower quadrant is  irrigated with warm saline which is evacuated. Good hemostasis is noted. Ports are removed under direct vision. Good hemostasis is noted at the port sites. Pneumoperitoneum is released.  Skin incisions are anesthetized with local anesthetic. Wounds are closed with interrupted 4-0 Monocryl subcuticular sutures. Wounds are washed and dried and Dermabond is applied to the incisions. The patient is awakened from anesthesia and brought to the recovery room. The patient tolerated the procedure well.  Velora Hecklerodd M. Fronie Holstein, MD, [facility] Office: [phone]

## 2014-09-10 NOTE — Op Note (Deleted)
OPERATIVE REPORT - LAPAROSCOPIC APPENDECTOMY  Preop diagnosis: Acute appendicitis  Postop diagnosis: Same  Procedure: Laparoscopic appendectomy  Surgeon:  Velora Hecklerodd M. Kyrese Gartman, MD, FACS  Anesthesia: General endotracheal  Estimated blood loss: Minimal  Preparation: Chlora-prep  Complications: None  Indications:  Patient is a 69 yo WF with diffuse abdominal pain localizing to the RLQ.  WBC 12K.  CT scan suspicious for acute appendicitis.  Procedure:  Patient is brought to the operating room and placed in a supine position on the operating room table. Following administration of general anesthesia, a time out was held and the patient's name and procedure is confirmed. Patient is then prepped and draped in the usual strict aseptic fashion.  After ascertaining that an adequate level of anesthesia has been achieved, a peri-umbilical incision is made with a #15 blade. Dissection is carried down to the fascia. Fascia is incised in the midline and the peritoneal cavity is entered cautiously. A #0-vicryl pursestring suture is placed in the fascia. An Hassan cannula is introduced under direct vision and secured with the pursestring suture. The abdomen is insufflated with carbon dioxide. The laparoscope is introduced and the abdomen is explored. Operative ports are placed in the right upper quadrant and left lower quadrant. The appendix is identified. The mesoappendix is divided with the harmonic scalpel. Dissection is carried down to the base of the appendix. The base of the appendix is dissected out clearing the junction with the cecal wall. Using an Endo-GIA stapler, the base of the appendix is transected at the junction with the cecal wall. There is good approximation of tissue along the staple line. There is good hemostasis along the staple line. The appendix is placed into an endo-catch bag and withdrawn through the umbilical port. The #0-vicryl pursestring suture is tied securely.  Right lower quadrant  is irrigated with warm saline which is evacuated. Good hemostasis is noted. Ports are removed under direct vision. Good hemostasis is noted at the port sites. Pneumoperitoneum is released.  Skin incisions are anesthetized with local anesthetic. Wounds are closed with interrupted 4-0 Monocryl subcuticular sutures. Wounds are washed and dried and benzoin and Steri-Strips are applied. Dressings are applied. The patient is awakened from anesthesia and brought to the recovery room. The patient tolerated the procedure well.  Velora Hecklerodd M. Chistian Kasler, MD, [facility] Office: [phone]

## 2014-09-10 NOTE — Transfer of Care (Signed)
Immediate Anesthesia Transfer of Care Note  Patient: Joan [name]  Procedure(s) Performed: Procedure(s): APPENDECTOMY LAPAROSCOPIC (N/A)  Patient Location: PACU  Anesthesia Type:General  Level of Consciousness: awake, alert , oriented and patient cooperative  Airway & Oxygen Therapy: Patient Spontanous Breathing and Patient connected to face mask oxygen  Post-op Assessment: Report given to RN, Post -op Vital signs reviewed and stable and Patient moving all extremities X 4  Post vital signs: stable  Last Vitals:  Filed Vitals:   09/10/14 0455  BP: 101/43  Pulse: 69  Temp: 36.9 C  Resp: 14    Complications: No apparent anesthesia complications 

## 2014-09-10 NOTE — Progress Notes (Signed)
Subjective: She says pain is worse and more in the RLQ.  She is more tender and does not feel better.    Objective: Vital signs in last 24 hours: Temp:  [98.2 F (36.8 C)-98.5 F (36.9 C)] 98.5 F (36.9 C) (02/02 0455) Pulse Rate:  [65-78] 69 (02/02 0455) Resp:  [14-18] 14 (02/02 0455) BP: (99-136)/(43-83) 101/43 mmHg (02/02 0455) SpO2:  [96 %-100 %] 100 % (02/02 0455) Weight:  [58.2 kg (128 lb 4.9 oz)] 58.2 kg (128 lb 4.9 oz) (02/02 0100)   NPO Afebrile, VSS WBC 11.1  Intake/Output from previous day: 02/01 0701 - 02/02 0700 In: 551.7 [I.V.:551.7] Out: 375 [Urine:375] Intake/Output this shift:    General appearance: alert, cooperative and no distress Resp: clear to auscultation bilaterally GI: soft but very tender, mid abdomen and RUQ most tender.  Lab Results:   Recent Labs  09/09/14 1925 09/10/14 0511  WBC 12.0* 11.1*  HGB 14.1 12.5  HCT 44.6 40.5  PLT 389 376    BMET  Recent Labs  09/09/14 1925  NA 137  K 4.0  CL 103  CO2 24  GLUCOSE 87  BUN 19  CREATININE 0.71  CALCIUM 9.6   PT/INR No results for input(s): LABPROT, INR in the last 72 hours.   Recent Labs Lab 09/09/14 1925  AST 32  ALT 21  ALKPHOS 82  BILITOT 1.1  PROT 7.9  ALBUMIN 4.4     Lipase     Component Value Date/Time   LIPASE 35 09/09/2014 1925     Studies/Results: Ct Abdomen Pelvis Wo Contrast  09/09/2014   CLINICAL DATA:  Abdominal pain, nausea, history ulcer colitis  EXAM: CT ABDOMEN AND PELVIS WITHOUT CONTRAST  TECHNIQUE: Multidetector CT imaging of the abdomen and pelvis was performed following the standard protocol without IV contrast.  COMPARISON:  CT 10/12/2012  FINDINGS: Lower chest:  Lung bases are clear.  Hepatobiliary: No focal hepatic lesion. Gallbladder is distended to 3.5 cm. No gallbladder inflammation.  Pancreas: Pancreas is normal. No ductal dilatation. No pancreatic inflammation. There is a prominent pancreatic accessory duct which could indicate pancreas  divisum.  Spleen: Prior splenectomy. Several small is splenule is left upper quadrant.  Adrenals/urinary tract: Adrenal glands are normal. Kidneys, ureters and bladder normal. The right ureter is mildly dilated but there is no obstructing calculus.  Stomach/Bowel: Stomach, small bowel, and cecum are normal. The appendix is dilated to 11 mm in the mid appendix and up to 11 mm the tail (image 49 and 45). Appendix is fluid-filled. There is no periappendiceal inflammation. Towards the orifice of the appendix there is mild hyperemia seen on sagittal image 34 (series 6). The appendix extends medial from the cecum and turns cephalad along the psoas muscle.  Colon and rectosigmoid colon are normal. No evidence of colonic inflammation or obstruction. There is a periampullary duodenum diverticulum along the C-loop of the duodenum measuring 2 cm (image 27, series 2.  Vascular/Lymphatic: Dull aorta is mildly calcified. No retroperitoneal periportal lymphadenopathy.  Reproductive: Uterus and ovaries are normal.  Musculoskeletal: No aggressive osseous lesion.  Other: No free fluid in the abdomen or pelvis.  IMPRESSION: 1. Appendix is mildly hyperemic at the base and dilated at the tip. While there is no periappendiceal inflammation, findings are concerning for early appendicitis. 2. Mildly dilated right ureter without obstructing lesion identified. No ureteral calculus or bladder calculi. 3. Incidental note of the duodenum diverticulum. Findings conveyed toBOWIE TRAN on 09/09/2014  at22:05.   Electronically Signed  By: Genevive BiStewart  Edmunds M.D.   On: 09/09/2014 22:05    Medications: . cefTRIAXone (ROCEPHIN)  IV  1 g Intravenous QHS  . metronidazole  500 mg Intravenous Q8H  . pantoprazole (PROTONIX) IV  40 mg Intravenous QHS    Assessment/Plan Abdominal pain with possible appendicitis Hx of Ulcerative colitis Autoimmune hemolytic anemia   Plan:  NPO/post for exploratory laparotomy/appendectomy later today   LOS: 1 day     Altonio Schwertner 09/10/2014

## 2014-09-10 NOTE — Anesthesia Postprocedure Evaluation (Signed)
Anesthesia Post Note  Patient: Joan [name]  Procedure(s) Performed: Procedure(s) (LRB): APPENDECTOMY LAPAROSCOPIC (N/A)  Anesthesia type: General  Patient location: PACU  Post pain: Pain level controlled  Post assessment: Post-op Vital signs reviewed  Last Vitals: BP 109/55 mmHg  Pulse 70  Temp(Src) 37.3 C (Oral)  Resp 14  Ht 5' 2.5" (1.588 m)  Wt 128 lb 4.9 oz (58.2 kg)  BMI 23.08 kg/m2  SpO2 99%  Post vital signs: Reviewed  Level of consciousness: sedated  Complications: No apparent anesthesia complications

## 2014-09-10 NOTE — Transfer of Care (Signed)
Immediate Anesthesia Transfer of Care Note  Patient: Joan [name]  Procedure(s) Performed: Procedure(s): APPENDECTOMY LAPAROSCOPIC (N/A)  Patient Location: PACU  Anesthesia Type:General  Level of Consciousness: awake, alert , oriented and patient cooperative  Airway & Oxygen Therapy: Patient Spontanous Breathing and Patient connected to face mask oxygen  Post-op Assessment: Report given to RN, Post -op Vital signs reviewed and stable and Patient moving all extremities X 4  Post vital signs: stable  Last Vitals:  Filed Vitals:   09/10/14 0455  BP: 101/43  Pulse: 69  Temp: 36.9 C  Resp: 14    Complications: No apparent anesthesia complications

## 2014-09-11 ENCOUNTER — Encounter (HOSPITAL_COMMUNITY): Payer: Self-pay | Admitting: Surgery

## 2014-09-11 MED ORDER — OXYCODONE HCL 5 MG PO TABS
5.0000 mg | ORAL_TABLET | ORAL | Status: DC | PRN
  Administered 2014-09-11: 10 mg via ORAL
  Filled 2014-09-11: qty 2

## 2014-09-11 MED ORDER — OXYCODONE HCL 5 MG PO TABS: 5.0000 mg | ORAL_TABLET | ORAL | Status: AC | PRN

## 2014-09-11 NOTE — Discharge Summary (Signed)
Physician Discharge Summary  Patient ID: Joan [name] MRN: [id number] DOB/AGE: September 10, 1945 69 y.o.  Admit date: 09/09/2014 Discharge date: 09/11/2014  Admission Diagnoses:  Abdominal pain with possible appendicitis Hx of Ulcerative colitis Autoimmune hemolytic anemia  Discharge Diagnoses:  Acute appendicitis Hx of Ulcerative colitis Hx of autoimmune hemolytic anemia  Active Problems:   Acute appendicitis   PROCEDURES:LAPAROSCOPIC APPENDECTOMY, 09/10/14, Dr. Alean Rinneodd Gerkin    Hospital Course:  This is a 69 year old female with a history of ulcerative colitis that has been quiescent. For the past few months she has had intermittent sharp pains in the upper abdomen and right lower quadrant area. These are self-limited. She woke this morning at approximately 2:30 with gradually increasing pain and they'll bring her epigastrium and radiating to the left upper quadrant, right upper quadrant, and right lower quadrant. The pains are described as sharp in the intensity progressively worsened. No fever or chills. Mild nausea but no vomiting. No dysuria or hematuria. No diarrhea. She presented to the emergency department and was noted to have a mild leukocytosis. A CT scan was performed. This demonstrated an appendix that was dilated at 11 mm in size without any periappendiceal inflammatory changes. The appendix was fluid-filled. There was concern for the possibility of early acute appendicitis and I was asked to see her. She's been given some morphine and now her pain is significantly better. She was admitted for observation, pain was more localized the following AM.  Her pain became much worse, WBC was better but still elevated.  We posted her and she went to the OR later that day.  She did have an appendicitis.  She did well post op and we plan to send her home later today after lunch.    Condition on d/c:  Improved  Disposition: Home     Medication List    TAKE these medications        DIGESTIVE ENZYMES PO  Take 1 capsule by mouth daily.     oxyCODONE 5 MG immediate release tablet  Commonly known as:  Oxy IR/ROXICODONE  Take 1-2 tablets (5-10 mg total) by mouth every 4 (four) hours as needed for moderate pain.     Vitamin D (Cholecalciferol) 1000 UNITS Caps  Take 6,000 Units by mouth daily.           Follow-up Information    Follow up with [facility] [place] On 10/01/2014.   Why:  You have an appointment at 3:45 PM, be there at the office 30 minutes early for check in.   Contact information:   [address] [place] [phone]      Signed: Sherrie GeorgeJENNINGS,Jermel Artley 09/11/2014, 12:24 PM

## 2014-09-11 NOTE — Discharge Instructions (Signed)
Laparoscopic Appendectomy Care After Refer to this sheet in the next few weeks. These instructions provide you with information on caring for yourself after your procedure. Your caregiver may also give you more specific instructions. Your treatment has been planned according to current medical practices, but problems sometimes occur. Call your caregiver if you have any problems or questions after your procedure. HOME CARE INSTRUCTIONS  Do not drive while taking narcotic pain medicines.  Use stool softener if you become constipated from your pain medicines.  Change your bandages (dressings) as directed.  Keep your wounds clean and dry. You may wash the wounds gently with soap and water. Gently pat the wounds dry with a clean towel.  Do not take baths, swim, or use hot tubs for 10 days, or as instructed by your caregiver.  Only take over-the-counter or prescription medicines for pain, discomfort, or fever as directed by your caregiver.  You may continue your normal diet as directed.  Do not lift more than 10 pounds (4.5 kg) or play contact sports for 3 weeks, or as directed.  Slowly increase your activity after surgery.  Take deep breaths to avoid getting a lung infection (pneumonia). SEEK MEDICAL CARE IF:  You have redness, swelling, or increasing pain in your wounds.  You have pus coming from your wounds.  You have drainage from a wound that lasts longer than 1 day.  You notice a bad smell coming from the wounds or dressing.  Your wound edges break open after stitches (sutures) have been removed.  You notice increasing pain in the shoulders (shoulder strap areas) or near your shoulder blades.  You develop dizzy episodes or fainting while standing.  You develop shortness of breath.  You develop persistent nausea or vomiting.  You cannot control your bowel functions or lose your appetite.  You develop diarrhea. SEEK IMMEDIATE MEDICAL CARE IF:   You have a fever.  You  develop a rash.  You have difficulty breathing or sharp pains in your chest.  You develop any reaction or side effects to medicines given. MAKE SURE YOU:  Understand these instructions.  Will watch your condition.  Will get help right away if you are not doing well or get worse. Document Released: 07/26/2005 Document Revised: 10/18/2011 Document Reviewed: 02/02/2011 [facility] Patient Information 2015 [place], [place]. This information is not intended to replace advice given to you by your health care provider. Make sure you discuss any questions you have with your health care provider.  [facility] ______[facility] LAPAROSCOPIC SURGERY: POST OP INSTRUCTIONS Always review your discharge instruction sheet given to you by the facility where your surgery was performed. IF YOU HAVE DISABILITY OR FAMILY LEAVE FORMS, YOU MUST BRING THEM TO THE OFFICE FOR PROCESSING.   DO NOT GIVE THEM TO YOUR DOCTOR.  1. A prescription for pain medication may be given to you upon discharge.  Take your pain medication as prescribed, if needed.  If narcotic pain medicine is not needed, then you may take acetaminophen (Tylenol) or ibuprofen (Advil) as needed. 2. Take your usually prescribed medications unless otherwise directed. 3. If you need a refill on your pain medication, please contact your pharmacy.  They will contact our office to request authorization. Prescriptions will not be filled after 5pm or on week-ends. 4. You should follow a light diet the first few days after arrival home, such as soup and crackers, etc.  Be sure to include lots of fluids daily. 5. Most patients will experience some swelling and  bruising in the area of the incisions.  Ice packs will help.  Swelling and bruising can take several days to resolve.  6. It is common to experience some constipation if taking pain medication after surgery.  Increasing fluid intake and taking a stool softener (such as Colace) will usually help or  prevent this problem from occurring.  A mild laxative (Milk of Magnesia or Miralax) should be taken according to package instructions if there are no bowel movements after 48 hours. 7. Unless discharge instructions indicate otherwise, you may remove your bandages 24-48 hours after surgery, and you may shower at that time.  You may have steri-strips (small skin tapes) in place directly over the incision.  These strips should be left on the skin for 7-10 days.  If your surgeon used skin glue on the incision, you may shower in 24 hours.  The glue will flake off over the next 2-3 weeks.  Any sutures or staples will be removed at the office during your follow-up visit. 8. ACTIVITIES:  You may resume regular (light) daily activities beginning the next day--such as daily self-care, walking, climbing stairs--gradually increasing activities as tolerated.  You may have sexual intercourse when it is comfortable.  Refrain from any heavy lifting or straining for 2 weeks.  a. You may drive when you are no longer taking prescription pain medication, you can comfortably wear a seatbelt, and you can safely maneuver your car and apply brakes. b. RETURN TO WORK:  __________________________________________________________ 9. You should see your doctor in the office for a follow-up appointment approximately 2-3 weeks after your surgery.  Make sure that you call for this appointment within a day or two after you arrive home to insure a convenient appointment time. 10. OTHER INSTRUCTIONS: __________________________________________________________________________________________________________________________ __________________________________________________________________________________________________________________________ WHEN TO CALL YOUR DOCTOR: 1. Fever over 101.0 2. Inability to urinate 3. Continued bleeding from incision. 4. Increased pain, redness, or drainage from the incision. 5. Increasing abdominal  pain  The clinic staff is available to answer your questions during regular business hours.  Please dont hesitate to call and ask to speak to one of the nurses for clinical concerns.  If you have a medical emergency, go to the nearest emergency room or call 911.  A surgeon from [facility] is always on call at the hospital. [address], [address], [place], [place]  [id number] ? [address] [address], [place], [place]   [id number] [phone] ? [phone] ? FAX [phone] Web site: [email or web address]

## 2014-09-11 NOTE — Progress Notes (Signed)
1 Day Post-Op  Subjective: Joan [name] looks great, eating eggs.  Sites look fine.  Joan [name] cannot take Asprin or tylenol.  I think Joan [name] can go home after lunch.   Objective: Vital signs in last 24 hours: Temp:  [97.7 F (36.5 C)-99.1 F (37.3 C)] 98.7 F (37.1 C) (02/03 0525) Pulse Rate:  [55-77] 60 (02/03 0525) Resp:  [14-16] 16 (02/03 0525) BP: (102-134)/(46-58) 102/46 mmHg (02/03 0525) SpO2:  [96 %-100 %] 96 % (02/03 0525) Last BM Date: 09/09/14 1020 po recorded Afebrile, VSS No llabs Intake/Output from previous day: 02/02 0701 - 02/03 0700 In: 2852.5 [P.O.:1020; I.V.:1832.5] Out: 1700 [Urine:1650; Blood:50] Intake/Output this shift:    General appearance: alert, cooperative and no distress Resp: clear to auscultation bilaterally GI: soft, sore, sites look fine.  Lab Results:   Recent Labs  09/09/14 1925 09/10/14 0511  WBC 12.0* 11.1*  HGB 14.1 12.5  HCT 44.6 40.5  PLT 389 376    BMET  Recent Labs  09/09/14 1925  NA 137  K 4.0  CL 103  CO2 24  GLUCOSE 87  BUN 19  CREATININE 0.71  CALCIUM 9.6   PT/INR No results for input(s): LABPROT, INR in the last 72 hours.   Recent Labs Lab 09/09/14 1925  AST 32  ALT 21  ALKPHOS 82  BILITOT 1.1  PROT 7.9  ALBUMIN 4.4     Lipase     Component Value Date/Time   LIPASE 35 09/09/2014 1925     Studies/Results: Ct Abdomen Pelvis Wo Contrast  09/09/2014   CLINICAL DATA:  Abdominal pain, nausea, history ulcer colitis  EXAM: CT ABDOMEN AND PELVIS WITHOUT CONTRAST  TECHNIQUE: Multidetector CT imaging of the abdomen and pelvis was performed following the standard protocol without IV contrast.  COMPARISON:  CT 10/12/2012  FINDINGS: Lower chest:  Lung bases are clear.  Hepatobiliary: No focal hepatic lesion. Gallbladder is distended to 3.5 cm. No gallbladder inflammation.  Pancreas: Pancreas is normal. No ductal dilatation. No pancreatic inflammation. There is a prominent pancreatic accessory duct which could indicate  pancreas divisum.  Spleen: Prior splenectomy. Several small is splenule is left upper quadrant.  Adrenals/urinary tract: Adrenal glands are normal. Kidneys, ureters and bladder normal. The right ureter is mildly dilated but there is no obstructing calculus.  Stomach/Bowel: Stomach, small bowel, and cecum are normal. The appendix is dilated to 11 mm in the mid appendix and up to 11 mm the tail (image 49 and 45). Appendix is fluid-filled. There is no periappendiceal inflammation. Towards the orifice of the appendix there is mild hyperemia seen on sagittal image 34 (series 6). The appendix extends medial from the cecum and turns cephalad along the psoas muscle.  Colon and rectosigmoid colon are normal. No evidence of colonic inflammation or obstruction. There is a periampullary duodenum diverticulum along the C-loop of the duodenum measuring 2 cm (image 27, series 2.  Vascular/Lymphatic: Dull aorta is mildly calcified. No retroperitoneal periportal lymphadenopathy.  Reproductive: Uterus and ovaries are normal.  Musculoskeletal: No aggressive osseous lesion.  Other: No free fluid in the abdomen or pelvis.  IMPRESSION: 1. Appendix is mildly hyperemic at the base and dilated at the tip. While there is no periappendiceal inflammation, findings are concerning for early appendicitis. 2. Mildly dilated right ureter without obstructing lesion identified. No ureteral calculus or bladder calculi. 3. Incidental note of the duodenum diverticulum. Findings conveyed toBOWIE TRAN on 09/09/2014  at22:05.   Electronically Signed   By: Genevive Bi M.D.   On:  09/09/2014 22:05    Medications: . cefTRIAXone (ROCEPHIN)  IV  1 g Intravenous Q12H    Assessment/Plan Acute appendicitis Hx of Ulcerative colitis Hx of autoimmune hemolytic anemia   Plan:  Advance diet, change her to plain oxycodone.   I will her home after lunch.   LOS: 2 days    Joan [name] 09/11/2014

## 2014-09-11 NOTE — Care Management Note (Signed)
    Page 1 of 1   09/11/2014     9:57:00 AM CARE MANAGEMENT NOTE 09/11/2014  Patient:  Joan [name],Joan [name]   Account Number:  [id number]  Date Initiated:  09/11/2014  Documentation initiated by:  Lorenda IshiharaPEELE,Leighton Brickley  Subjective/Objective Assessment:   69 yo female admitted s/p lap appy.     Action/Plan:   Home when stable   Anticipated DC Date:  09/11/2014   Anticipated DC Plan:  HOME/SELF CARE      DC Planning Services  CM consult      Choice offered to / List presented to:             Status of service:  Completed, signed off Medicare Important Message given?   (If response is "NO", the following Medicare IM given date fields will be blank) Date Medicare IM given:   Medicare IM given by:   Date Additional Medicare IM given:   Additional Medicare IM given by:    Discharge Disposition:  HOME/SELF CARE  Per UR Regulation:  Reviewed for med. necessity/level of care/duration of stay  If discussed at Long Length of Stay Meetings, dates discussed:    Comments:

## 2015-04-21 ENCOUNTER — Telehealth: Payer: Self-pay

## 2015-04-22 NOTE — Telephone Encounter (Signed)
Opened in error

## 2015-04-23 ENCOUNTER — Ambulatory Visit (INDEPENDENT_AMBULATORY_CARE_PROVIDER_SITE_OTHER): Payer: Medicare Other | Admitting: Gastroenterology

## 2015-04-23 ENCOUNTER — Encounter: Payer: Self-pay | Admitting: Gastroenterology

## 2015-04-23 ENCOUNTER — Encounter (HOSPITAL_COMMUNITY): Payer: Self-pay | Admitting: *Deleted

## 2015-04-23 VITALS — BP 124/76 | HR 68 | Ht 62.25 in | Wt 131.4 lb

## 2015-04-23 DIAGNOSIS — K5190 Ulcerative colitis, unspecified, without complications: Secondary | ICD-10-CM | POA: Diagnosis not present

## 2015-04-23 DIAGNOSIS — R109 Unspecified abdominal pain: Secondary | ICD-10-CM | POA: Diagnosis not present

## 2015-04-23 DIAGNOSIS — R1013 Epigastric pain: Secondary | ICD-10-CM | POA: Diagnosis not present

## 2015-04-23 DIAGNOSIS — G8929 Other chronic pain: Secondary | ICD-10-CM | POA: Diagnosis not present

## 2015-04-23 MED ORDER — PANTOPRAZOLE SODIUM 40 MG PO TBEC: 40.0000 mg | DELAYED_RELEASE_TABLET | Freq: Every day | ORAL | Status: DC

## 2015-04-23 MED ORDER — NA SULFATE-K SULFATE-MG SULF 17.5-3.13-1.6 GM/177ML PO SOLN: 1.0000 | Freq: Once | ORAL | Status: DC

## 2015-04-23 NOTE — Patient Instructions (Signed)
You have been scheduled for an endoscopy. Please follow written instructions given to you at your visit today. If you use inhalers (even only as needed), please bring them with you on the day of your procedure. Your physician has requested that you go to www.startemmi.com and enter the access code given to you at your visit today. This web site gives a general overview about your procedure. However, you should still follow specific instructions given to you by our office regarding your preparation for the procedure.  You have been scheduled at [facility] for your colonoscopy, Separate instructions have been given

## 2015-04-23 NOTE — Progress Notes (Signed)
  _                                                                                                                History of Present Illness:  [name] is a pleasant 69-year-old white female referred at the request of Dr. Smith for evaluation of abdominal pain.  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  Colitis presented with bleeding and diarrhea.  She probably went on includes an-3 diet and has been asymptomatic for years.  She is on no medications.  Approximate 6 months ago she underwent an appendectomy for a perforated appendicitis.  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  The proximal margin was uninvolved.  Since his surgery she has been complaining of progressive epigastric discomfort and postprandial fullness.  Pain is without radiation and slightly worsened with eating.  It is very slightly relieved with ranitidine or an acids She denies nausea, vomiting or pyrosis.  She's on no gastric irritants including non-steroidal's.  There has been no change in bowel habits although stools have turned greenish in color.  She denies rectal bleeding or melanotic  Weight has been stable.  Pain has been slowly progressive and frequent.  According to the patient recent lab work was unremarkable.  CT scan in February, 2016 demonstrated mild hyperemia at the appendiceal tip and a mildly dilated right ureter.  No abnormalities were seen in the foregut.   Past Medical History  Diagnosis Date  . Ulcerative colitis   . Autoimmune hemolytic anemia   . Right tibial fracture   . Left wrist fracture   . Heart murmur   . Mitral valve prolapse   . Hypothyroidism   . Pneumonia    Past Surgical History  Procedure Laterality Date  . Splenectomy    . Laparotomy    . Tonsillectomy    . Laparoscopic appendectomy N/A 09/10/2014    Procedure: APPENDECTOMY LAPAROSCOPIC;  Surgeon: Todd Gerkin, MD;   Location: [facility];  Service: General;  Laterality: N/A;   family history includes Breast cancer in her daughter; Cancer in her son; Cystic fibrosis in her son; Heart disease in her father; Other in her maternal aunt; Stroke in her mother. Current Outpatient Prescriptions  Medication Sig Dispense Refill  . DIGESTIVE ENZYMES PO Take 1 capsule by mouth as needed.     . Magnesium Oxide 400 MG CAPS Take 1 capsule by mouth daily.    . Multiple Vitamin (MULTIVITAMIN) tablet Take 1 tablet by mouth daily.    . Omega-3 Fatty Acids (OMEGA 3 PO) Take 1 tablet by mouth daily.    . oxyCODONE (OXY IR/ROXICODONE) 5 MG immediate release tablet Take 1-2 tablets (5-10 mg total) by mouth every 4 (four) hours as needed for moderate pain. 40 tablet 0  . Probiotic Product (PROBIOTIC DAILY PO) Take 1 tablet by mouth daily.    . ranitidine (ZANTAC) 150 MG capsule Take 150 mg by mouth 2 (  two) times daily.    . Vitamin D, Cholecalciferol, 1000 UNITS CAPS Take 2,000 Units by mouth daily.      No current facility-administered medications for this visit.   Allergies as of 04/23/2015 - Review Complete 04/23/2015  Allergen Reaction Noted  . Iodinated diagnostic agents Other (See Comments) 10/12/2012  . Aminoglycosides  09/09/2014  . Corn-containing products  09/09/2014  . Gluten meal  09/09/2014  . Lactose intolerance (gi)  09/09/2014  . Rice  09/09/2014  . Soy allergy  09/09/2014    reports that she has never smoked. She has never used smokeless tobacco. She reports that she does not drink alcohol or use illicit drugs.   Review of Systems: Pertinent positive and negative review of systems were noted in the above HPI section. All other review of systems were otherwise negative.  Vital signs were reviewed in today's medical record Physical Exam: General: Well developed , well nourished, visibly uncomfortable due to persistent abdominal pain Skin: anicteric Head: Normocephalic and atraumatic Eyes:  sclerae  anicteric, EOMI Ears: Normal auditory acuity Mouth: No deformity or lesions Neck: Supple, no masses or thyromegaly Lymph Nodes: no lymphadenopathy Lungs: Clear throughout to auscultation Heart: Regular rate and rhythm; no murmurs, rubs or bruits Gastroinestinal: Soft, non tender and non distended. No masses, hepatosplenomegaly or hernias noted. Normal Bowel sounds Rectal:deferred Musculoskeletal: Symmetrical with no gross deformities  Skin: No lesions on visible extremities Pulses:  Normal pulses noted Extremities: No clubbing, cyanosis, edema or deformities noted Neurological: Alert oriented x 4, grossly nonfocal Cervical Nodes:  No significant cervical adenopathy Inguinal Nodes: No significant inguinal adenopathy Psychological:  Alert and cooperative. Normal mood and affect  See Assessment and Plan under Problem List

## 2015-04-23 NOTE — Assessment & Plan Note (Signed)
History of ulcerative colitis diagnosed by colonoscopy in [place], soft [place].  Patient has been asymptomatic off all meds for several years.  Recommendations #1 colonoscopy

## 2015-04-23 NOTE — Assessment & Plan Note (Addendum)
7 month history of progressive epigastric pain with postprandial bloating.  Symptoms could be due to ulcer or nonulcer dyspepsia.  Doubt chronic cholecystitis.  Occult pancreatic neoplasm could present in this manner.  I doubt this is related to colitis.  Recommendations #1 begin Protonix 40 g daily #2 upper endoscopy #3 if endoscopy is negative may consider repeat CT of the abdomen #4 review prior lab work

## 2015-04-24 ENCOUNTER — Encounter: Payer: Self-pay | Admitting: Gastroenterology

## 2015-04-24 ENCOUNTER — Ambulatory Visit (AMBULATORY_SURGERY_CENTER): Payer: Medicare Other | Admitting: Gastroenterology

## 2015-04-24 VITALS — BP 142/77 | HR 73 | Temp 97.2°F | Resp 13 | Ht 62.0 in | Wt 131.0 lb

## 2015-04-24 DIAGNOSIS — K257 Chronic gastric ulcer without hemorrhage or perforation: Secondary | ICD-10-CM | POA: Diagnosis not present

## 2015-04-24 DIAGNOSIS — R109 Unspecified abdominal pain: Secondary | ICD-10-CM

## 2015-04-24 MED ORDER — SODIUM CHLORIDE 0.9 % IV SOLN
500.0000 mL | INTRAVENOUS | Status: DC
Start: 2015-04-24 — End: 2015-04-25

## 2015-04-24 NOTE — Progress Notes (Signed)
Report to PACU, RN, vss, BBS= Clear.  

## 2015-04-24 NOTE — Patient Instructions (Signed)
Discharge instructions given. Biopsies taken. Resume previous medications. YOU HAD AN ENDOSCOPIC PROCEDURE TODAY AT THE [facility]:   Refer to the procedure report that was given to you for any specific questions about what was found during the examination.  If the procedure report does not answer your questions, please call your gastroenterologist to clarify.  If you requested that your care partner not be given the details of your procedure findings, then the procedure report has been included in a sealed envelope for you to review at your convenience later.  YOU SHOULD EXPECT: Some feelings of bloating in the abdomen. Passage of more gas than usual.  Walking can help get rid of the air that was put into your GI tract during the procedure and reduce the bloating. If you had a lower endoscopy (such as a colonoscopy or flexible sigmoidoscopy) you may notice spotting of blood in your stool or on the toilet paper. If you underwent a bowel prep for your procedure, you may not have a normal bowel movement for a few days.  Please Note:  You might notice some irritation and congestion in your nose or some drainage.  This is from the oxygen used during your procedure.  There is no need for concern and it should clear up in a day or so.  SYMPTOMS TO REPORT IMMEDIATELY:   Following upper endoscopy (EGD)  Vomiting of blood or coffee ground material  New chest pain or pain under the shoulder blades  Painful or persistently difficult swallowing  New shortness of breath  Fever of 100F or higher  Black, tarry-looking stools  For urgent or emergent issues, a gastroenterologist can be reached at any hour by calling [phone].   DIET: Your first meal following the procedure should be a small meal and then it is ok to progress to your normal diet. Heavy or fried foods are harder to digest and may make you feel nauseous or bloated.  Likewise, meals heavy in dairy and vegetables can increase  bloating.  Drink plenty of fluids but you should avoid alcoholic beverages for 24 hours.  ACTIVITY:  You should plan to take it easy for the rest of today and you should NOT DRIVE or use heavy machinery until tomorrow (because of the sedation medicines used during the test).    FOLLOW UP: Our staff will call the number listed on your records the next business day following your procedure to check on you and address any questions or concerns that you may have regarding the information given to you following your procedure. If we do not reach you, we will leave a message.  However, if you are feeling well and you are not experiencing any problems, there is no need to return our call.  We will assume that you have returned to your regular daily activities without incident.  If any biopsies were taken you will be contacted by phone or by letter within the next 1-3 weeks.  Please call us at [phone] if you have not heard about the biopsies in 3 weeks.    SIGNATURES/CONFIDENTIALITY: You and/or your care partner have signed paperwork which will be entered into your electronic medical record.  These signatures attest to the fact that that the information above on your After Visit Summary has been reviewed and is understood.  Full responsibility of the confidentiality of this discharge information lies with you and/or your care-partner. 

## 2015-04-24 NOTE — Progress Notes (Signed)
Called to room to assist during endoscopic procedure.  Patient ID and intended procedure confirmed with present staff. Received instructions for my participation in the procedure from the performing physician.  

## 2015-04-24 NOTE — Op Note (Signed)
[facility] [address] [place], [address]   ENDOSCOPY PROCEDURE REPORT  PATIENT: Joan [name], Joan [name]  MR#: [id number] BIRTHDATE: Feb 23, 1946 , 69  yrs. old GENDER: female ENDOSCOPIST: Louis Meckel, MD REFERRED BY:  Merri Brunette, M.D. PROCEDURE DATE:  04/24/2015 PROCEDURE:  EGD w/ biopsy ASA CLASS:     Class II INDICATIONS:  epigastric pain. MEDICATIONS: Monitored anesthesia care and Propofol 130 mg IV TOPICAL ANESTHETIC:  DESCRIPTION OF PROCEDURE: After the risks benefits and alternatives of the procedure were thoroughly explained, informed consent was obtained.  The [id number] endoscope was introduced through the mouth and advanced to the second portion of the duodenum , Without limitations.  The instrument was slowly withdrawn as the mucosa was fully examined.    STOMACH: A large non-bleeding, clean-based and deep ulcer, measuring 20 x 5mm in size, with surrounding edema was found in the gastric antrum.  Biopsies were taken at edge of the ulcer and around the ulcer.   Except for the findings listed, the EGD was otherwise normal.  Retroflexed views revealed no abnormalities.     The scope was then withdrawn from the patient and the procedure completed.  COMPLICATIONS: There were no immediate complications.  ENDOSCOPIC IMPRESSION: 1.   Large ulcer, measuring 20 x 5mm in size, was found in the gastric antrum; biopsies were taken 2.   EGD was otherwise normal  RECOMMENDATIONS: 1.  Await biopsy results 2.  Continue PPI - protonix  REPEAT EXAM: EGD in 8 weeks  eSigned:  Louis Meckel, MD 04/24/2015 2:45 PM    CC:

## 2015-04-25 ENCOUNTER — Telehealth: Payer: Self-pay | Admitting: *Deleted

## 2015-04-25 NOTE — Telephone Encounter (Signed)
  Follow up Call-  Call back number 04/24/2015  Post procedure Call Back phone  # [phone]  Permission to leave phone message Yes     Patient questions:  Do you have a fever, pain , or abdominal swelling? No. Pain Score  0 *  Have you tolerated food without any problems? Yes.    Have you been able to return to your normal activities? Yes.    Do you have any questions about your discharge instructions: Diet   No. Medications  No. Follow up visit  No.  Do you have questions or concerns about your Care? No.  Actions: * If pain score is 4 or above: No action needed, pain <4.

## 2015-04-28 ENCOUNTER — Encounter: Payer: Self-pay | Admitting: Gastroenterology

## 2015-04-28 ENCOUNTER — Telehealth: Payer: Self-pay | Admitting: Gastroenterology

## 2015-04-28 NOTE — Telephone Encounter (Signed)
Patient advised that we have placed a suprep sample at the front desk for pick up. She verbalizes understanding.

## 2015-04-30 ENCOUNTER — Ambulatory Visit (HOSPITAL_COMMUNITY)
Admission: RE | Admit: 2015-04-30 | Discharge: 2015-04-30 | Disposition: A | Discharge status: Home / Self Care | Payer: Medicare Other | Source: Ambulatory Visit | Attending: Gastroenterology | Admitting: Gastroenterology

## 2015-04-30 ENCOUNTER — Encounter (HOSPITAL_COMMUNITY)
Admission: RE | Disposition: A | Discharge status: Home / Self Care | Payer: Self-pay | Source: Ambulatory Visit | Attending: Gastroenterology

## 2015-04-30 ENCOUNTER — Ambulatory Visit (HOSPITAL_COMMUNITY): Payer: Medicare Other | Admitting: Anesthesiology

## 2015-04-30 ENCOUNTER — Encounter (HOSPITAL_COMMUNITY): Payer: Self-pay | Admitting: Anesthesiology

## 2015-04-30 DIAGNOSIS — I252 Old myocardial infarction: Secondary | ICD-10-CM | POA: Insufficient documentation

## 2015-04-30 DIAGNOSIS — K529 Noninfective gastroenteritis and colitis, unspecified: Secondary | ICD-10-CM | POA: Insufficient documentation

## 2015-04-30 DIAGNOSIS — Z9081 Acquired absence of spleen: Secondary | ICD-10-CM | POA: Insufficient documentation

## 2015-04-30 DIAGNOSIS — Z79891 Long term (current) use of opiate analgesic: Secondary | ICD-10-CM | POA: Diagnosis not present

## 2015-04-30 DIAGNOSIS — G8929 Other chronic pain: Secondary | ICD-10-CM

## 2015-04-30 DIAGNOSIS — Z8711 Personal history of peptic ulcer disease: Secondary | ICD-10-CM | POA: Diagnosis not present

## 2015-04-30 DIAGNOSIS — R109 Unspecified abdominal pain: Secondary | ICD-10-CM | POA: Diagnosis not present

## 2015-04-30 DIAGNOSIS — I509 Heart failure, unspecified: Secondary | ICD-10-CM | POA: Insufficient documentation

## 2015-04-30 DIAGNOSIS — K5190 Ulcerative colitis, unspecified, without complications: Secondary | ICD-10-CM

## 2015-04-30 DIAGNOSIS — R1013 Epigastric pain: Secondary | ICD-10-CM

## 2015-04-30 DIAGNOSIS — Z79899 Other long term (current) drug therapy: Secondary | ICD-10-CM | POA: Diagnosis not present

## 2015-04-30 DIAGNOSIS — D591 Other autoimmune hemolytic anemias: Secondary | ICD-10-CM | POA: Insufficient documentation

## 2015-04-30 DIAGNOSIS — E039 Hypothyroidism, unspecified: Secondary | ICD-10-CM | POA: Diagnosis not present

## 2015-04-30 DIAGNOSIS — I341 Nonrheumatic mitral (valve) prolapse: Secondary | ICD-10-CM | POA: Diagnosis not present

## 2015-04-30 DIAGNOSIS — K219 Gastro-esophageal reflux disease without esophagitis: Secondary | ICD-10-CM | POA: Diagnosis not present

## 2015-04-30 DIAGNOSIS — I10 Essential (primary) hypertension: Secondary | ICD-10-CM | POA: Diagnosis not present

## 2015-04-30 HISTORY — DX: Personal history of other medical treatment: Z92.89

## 2015-04-30 HISTORY — DX: Gastro-esophageal reflux disease without esophagitis: K21.9

## 2015-04-30 HISTORY — PX: COLONOSCOPY WITH PROPOFOL: SHX5780

## 2015-04-30 SURGERY — COLONOSCOPY WITH PROPOFOL
Anesthesia: Monitor Anesthesia Care

## 2015-04-30 MED ORDER — PROPOFOL 10 MG/ML IV BOLUS
INTRAVENOUS | Status: AC
  Filled 2015-04-30: qty 20

## 2015-04-30 MED ORDER — GLYCOPYRROLATE 0.2 MG/ML IJ SOLN
INTRAMUSCULAR | Status: DC | PRN
  Administered 2015-04-30: 0.2 mg via INTRAVENOUS

## 2015-04-30 MED ORDER — SODIUM CHLORIDE 0.9 % IV SOLN: INTRAVENOUS | Status: DC

## 2015-04-30 MED ORDER — OMEPRAZOLE 40 MG PO CPDR: 40.0000 mg | DELAYED_RELEASE_CAPSULE | Freq: Every day | ORAL | Status: AC

## 2015-04-30 MED ORDER — PROPOFOL INFUSION 10 MG/ML OPTIME
INTRAVENOUS | Status: DC | PRN
  Administered 2015-04-30: 150 ug/kg/min via INTRAVENOUS

## 2015-04-30 MED ORDER — LACTATED RINGERS IV SOLN
INTRAVENOUS | Status: DC | PRN
  Administered 2015-04-30: 12:00:00 via INTRAVENOUS

## 2015-04-30 MED ORDER — LIDOCAINE HCL (CARDIAC) 20 MG/ML IV SOLN
INTRAVENOUS | Status: DC | PRN
  Administered 2015-04-30: 100 mg via INTRAVENOUS

## 2015-04-30 MED ORDER — GLYCOPYRROLATE 0.2 MG/ML IJ SOLN
INTRAMUSCULAR | Status: AC
  Filled 2015-04-30: qty 1

## 2015-04-30 SURGICAL SUPPLY — 22 items

## 2015-04-30 NOTE — Op Note (Signed)
[facility] [address] [place], [address]   COLONOSCOPY PROCEDURE REPORT     EXAM DATE: May 13, 2015  PATIENT NAME:      Joan [name], Joan [name]           MR #:      [id number]  BIRTHDATE:       [birth year]      VISIT #:     [id number]  ATTENDING:     Louis Meckel, MD     STATUS:     outpatient ASSISTANT:      Thurmond Butts, and Elby Showers   INDICATIONS:  The patient is a 69 yr old female here for a colonoscopy due to Inflammatory bowel disease of the intestine if more precise diagnosis or determination of the extent / severity of activity of disease will influence immediate / future management.  PROCEDURE PERFORMED:     Colonoscopy, diagnostic Colonoscopy with biopsy MEDICATIONS:     Monitored anesthesia care ESTIMATED BLOOD LOSS:     None  CONSENT: The patient understands the risks and benefits of the procedure and understands that these risks include, but are not limited to: sedation, allergic reaction, infection, perforation and/or bleeding. Alternative means of evaluation and treatment include, among others: physical exam, x-rays, and/or surgical intervention. The patient elects to proceed with this endoscopic procedure.  DESCRIPTION OF PROCEDURE: During intra-op preparation period all mechanical & medical equipment was checked for proper function. Hand hygiene and appropriate measures for infection prevention was taken. After the risks, benefits and alternatives of the procedure were thoroughly explained, Informed consent was verified, confirmed and timeout was successfully executed by the treatment team. A digital exam revealed no abnormalities of the rectum. The Pentax Adult Colonscope [id number] endoscope was introduced through the anus and advanced to the cecum, which was identified by both the appendix and ileocecal valve. (Suprep was used) excellent. The instrument was then slowly withdrawn as the colon was fully  examined.Estimated blood loss is zero unless otherwise noted in this procedure report.    COLON FINDINGS: Beginning in the rectum and extending to the distal transverse colon there was mild, faint erythema.  There were areas of scarring.  biopsies were taken randomly throughout the left colon.  The colon proximal to the distal transverse colon was entirely normal. Retroflexed views revealed no abnormalities. The scope was then completely withdrawn from the patient and the procedure terminated. SCOPE WITHDRAWAL TIME:    ADVERSE EVENTS:      There were no immediate complications.  IMPRESSIONS:     inactive colitis involving the left colon extending to the distal transverse colon  RECOMMENDATIONS:     Await biopsy results RECALL:  _____________________________ Louis Meckel, MD eSigned:  Louis Meckel, MD 05-13-15 2:15 PM   cc:   CPT CODES: ICD CODES:  The ICD and CPT codes recommended by this software are interpretations from the data that the clinical staff has captured with the software.  The verification of the translation of this report to the ICD and CPT codes and modifiers is the sole responsibility of the health care institution and practicing physician where this report was generated.  [employer] will not be held responsible for the validity of the ICD and CPT codes included on this report.  AMA assumes no liability for data contained or not contained herein. CPT is a Publishing rights manager of the [employer].   PATIENT NAME:  Joan [name], Joan [name] MR#: [id number]

## 2015-04-30 NOTE — Anesthesia Preprocedure Evaluation (Addendum)
Anesthesia Evaluation  Patient identified by MRN, date of birth, ID band Patient awake    Reviewed: Allergy & Precautions, NPO status , Patient's Chart, lab work & pertinent test results  History of Anesthesia Complications Negative for: history of anesthetic complications  Airway Mallampati: II  TM Distance: >3 FB Neck ROM: Full    Dental  (+) Teeth Intact   Pulmonary neg pulmonary ROS,    breath sounds clear to auscultation       Cardiovascular (-) hypertension(-) Past MI and (-) CHF + Valvular Problems/Murmurs  Rhythm:Regular     Neuro/Psych negative neurological ROS  negative psych ROS   GI/Hepatic PUD, GERD  Medicated,  Endo/Other    Renal/GU      Musculoskeletal negative musculoskeletal ROS (+)   Abdominal   Peds  Hematology  (+) anemia ,   Anesthesia Other Findings   Reproductive/Obstetrics                             Anesthesia Physical Anesthesia Plan  ASA: II  Anesthesia Plan: MAC   Post-op Pain Management:    Induction: Intravenous  Airway Management Planned: Nasal Cannula  Additional Equipment: None  Intra-op Plan:   Post-operative Plan:   Informed Consent: I have reviewed the patients History and Physical, chart, labs and discussed the procedure including the risks, benefits and alternatives for the proposed anesthesia with the patient or authorized representative who has indicated his/her understanding and acceptance.   Dental advisory given  Plan Discussed with: CRNA and Surgeon  Anesthesia Plan Comments:        Anesthesia Quick Evaluation

## 2015-04-30 NOTE — Discharge Instructions (Signed)

## 2015-04-30 NOTE — Transfer of Care (Signed)
Immediate Anesthesia Transfer of Care Note  Patient: Joan [name]  Procedure(s) Performed: Procedure(s): COLONOSCOPY WITH PROPOFOL (N/A)  Patient Location: PACU  Anesthesia Type:MAC  Level of Consciousness:  sedated, patient cooperative and responds to stimulation  Airway & Oxygen Therapy:Patient Spontanous Breathing and Patient connected to face mask oxgen  Post-op Assessment:  Report given to PACU RN and Post -op Vital signs reviewed and stable  Post vital signs:  Reviewed and stable  Last Vitals:  Filed Vitals:   04/30/15 1257  BP: 146/69  Pulse: 76  Temp: 36.7 C  Resp: 12    Complications: No apparent anesthesia complications

## 2015-04-30 NOTE — H&P (View-Only) (Signed)
_                                                                                                                History of Present Illness:  Joan [name] is a pleasant 69 year old white female referred at the request of Dr. Katrinka Blazing for evaluation of abdominal pain.  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  Colitis presented with bleeding and diarrhea.  She probably went on includes an-3 diet and has been asymptomatic for years.  She is on no medications.  Approximate 6 months ago she underwent an appendectomy for a perforated appendicitis.  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  The proximal margin was uninvolved.  Since his surgery she has been complaining of progressive epigastric discomfort and postprandial fullness.  Pain is without radiation and slightly worsened with eating.  It is very slightly relieved with ranitidine or an acids She denies nausea, vomiting or pyrosis.  She's on no gastric irritants including non-steroidal's.  There has been no change in bowel habits although stools have turned greenish in color.  She denies rectal bleeding or melanotic  Weight has been stable.  Pain has been slowly progressive and frequent.  According to the patient recent lab work was unremarkable.  CT scan in February, 2016 demonstrated mild hyperemia at the appendiceal tip and a mildly dilated right ureter.  No abnormalities were seen in the foregut.   Past Medical History  Diagnosis Date  . Ulcerative colitis   . Autoimmune hemolytic anemia   . Right tibial fracture   . Left wrist fracture   . Heart murmur   . Mitral valve prolapse   . Hypothyroidism   . Pneumonia    Past Surgical History  Procedure Laterality Date  . Splenectomy    . Laparotomy    . Tonsillectomy    . Laparoscopic appendectomy N/A 09/10/2014    Procedure: APPENDECTOMY LAPAROSCOPIC;  Surgeon: Darnell Level, MD;   Location: [facility] ORS;  Service: General;  Laterality: N/A;   family history includes Breast cancer in her daughter; Cancer in her son; Cystic fibrosis in her son; Heart disease in her father; Other in her maternal aunt; Stroke in her mother. Current Outpatient Prescriptions  Medication Sig Dispense Refill  . DIGESTIVE ENZYMES PO Take 1 capsule by mouth as needed.     . Magnesium Oxide 400 MG CAPS Take 1 capsule by mouth daily.    . Multiple Vitamin (MULTIVITAMIN) tablet Take 1 tablet by mouth daily.    . Omega-3 Fatty Acids (OMEGA 3 PO) Take 1 tablet by mouth daily.    Marland Kitchen oxyCODONE (OXY IR/ROXICODONE) 5 MG immediate release tablet Take 1-2 tablets (5-10 mg total) by mouth every 4 (four) hours as needed for moderate pain. 40 tablet 0  . Probiotic Product (PROBIOTIC DAILY PO) Take 1 tablet by mouth daily.    . ranitidine (ZANTAC) 150 MG capsule Take 150 mg by mouth 2 (  two) times daily.    . Vitamin D, Cholecalciferol, 1000 UNITS CAPS Take 2,000 Units by mouth daily.      No current facility-administered medications for this visit.   Allergies as of 04/23/2015 - Review Complete 04/23/2015  Allergen Reaction Noted  . Iodinated diagnostic agents Other (See Comments) 10/12/2012  . Aminoglycosides  09/09/2014  . Corn-containing products  09/09/2014  . Gluten meal  09/09/2014  . Lactose intolerance (gi)  09/09/2014  . Rice  09/09/2014  . Soy allergy  09/09/2014    reports that she has never smoked. She has never used smokeless tobacco. She reports that she does not drink alcohol or use illicit drugs.   Review of Systems: Pertinent positive and negative review of systems were noted in the above HPI section. All other review of systems were otherwise negative.  Vital signs were reviewed in today's medical record Physical Exam: General: Well developed , well nourished, visibly uncomfortable due to persistent abdominal pain Skin: anicteric Head: Normocephalic and atraumatic Eyes:  sclerae  anicteric, EOMI Ears: Normal auditory acuity Mouth: No deformity or lesions Neck: Supple, no masses or thyromegaly Lymph Nodes: no lymphadenopathy Lungs: Clear throughout to auscultation Heart: Regular rate and rhythm; no murmurs, rubs or bruits Gastroinestinal: Soft, non tender and non distended. No masses, hepatosplenomegaly or hernias noted. Normal Bowel sounds Rectal:deferred Musculoskeletal: Symmetrical with no gross deformities  Skin: No lesions on visible extremities Pulses:  Normal pulses noted Extremities: No clubbing, cyanosis, edema or deformities noted Neurological: Alert oriented x 4, grossly nonfocal Cervical Nodes:  No significant cervical adenopathy Inguinal Nodes: No significant inguinal adenopathy Psychological:  Alert and cooperative. Normal mood and affect  See Assessment and Plan under Problem List

## 2015-04-30 NOTE — Interval H&P Note (Signed)
History and Physical Interval Note:  04/30/2015 1:21 PM  Joan [name]  has presented today for surgery, with the diagnosis of abdominal pain   The various methods of treatment have been discussed with the patient and family. After consideration of risks, benefits and other options for treatment, the patient has consented to  Procedure(s): COLONOSCOPY WITH PROPOFOL (N/A) as a surgical intervention .  The patient's history has been reviewed, patient examined, no change in status, stable for surgery.  I have reviewed the patient's chart and labs.  Questions were answered to the patient's satisfaction.    The recent H&P (dated *04/23/15 **) was reviewed, the patient was examined and there is no change in the patients condition since that H&P was completed.   [name]  04/30/2015, 1:21 PM    [name]

## 2015-05-02 ENCOUNTER — Encounter (HOSPITAL_COMMUNITY): Payer: Self-pay | Admitting: Gastroenterology

## 2015-05-02 ENCOUNTER — Encounter: Payer: Self-pay | Admitting: Gastroenterology

## 2015-05-02 NOTE — Anesthesia Postprocedure Evaluation (Signed)
  Anesthesia Post-op Note  Patient: Joan [name]  Procedure(s) Performed: Procedure(s): COLONOSCOPY WITH PROPOFOL (N/A)  Patient Location: Endoscopy Unit  Anesthesia Type:MAC  Level of Consciousness: awake  Airway and Oxygen Therapy: Patient Spontanous Breathing  Post-op Pain: none  Post-op Assessment: Post-op Vital signs reviewed, Patient's Cardiovascular Status Stable, Respiratory Function Stable, Patent Airway, No signs of Nausea or vomiting and Pain level controlled              Post-op Vital Signs: Reviewed and stable  Last Vitals:  Filed Vitals:   04/30/15 1450  BP: 151/68  Pulse: 84  Temp:   Resp: 16    Complications: No apparent anesthesia complications

## 2015-07-01 ENCOUNTER — Encounter: Payer: Medicare Other | Admitting: Gastroenterology

## 2015-09-23 IMAGING — CT CT ABD-PELV W/O CM
1 of 2 series · 15 of 32 positions shown, 19 images · non-contrast
Comparison: CT 10/12/2012

CLINICAL DATA: Abdominal pain, nausea, history ulcer colitis

EXAM:
CT ABDOMEN AND PELVIS WITHOUT CONTRAST
TECHNIQUE: Multidetector CT imaging of the abdomen and pelvis was performed
following the standard protocol without IV contrast.

[Series 2: abd/pel w/o · axial · non-contrast · 0.74mm/px · z∈[+1176,+1586]mm · 15 of 90 slices shown, 19 images]
[im 4/90  soft-tissue]
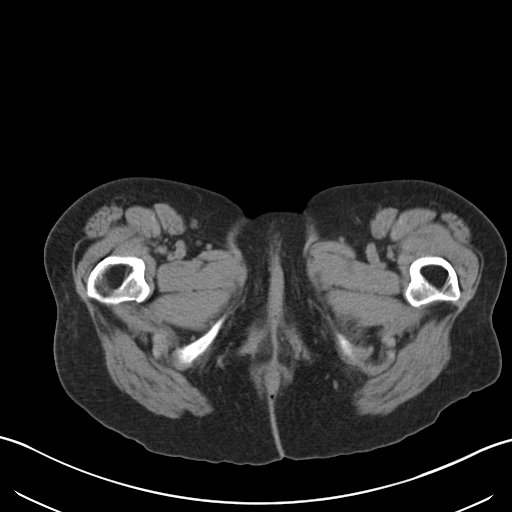
[im 4/90  bone]
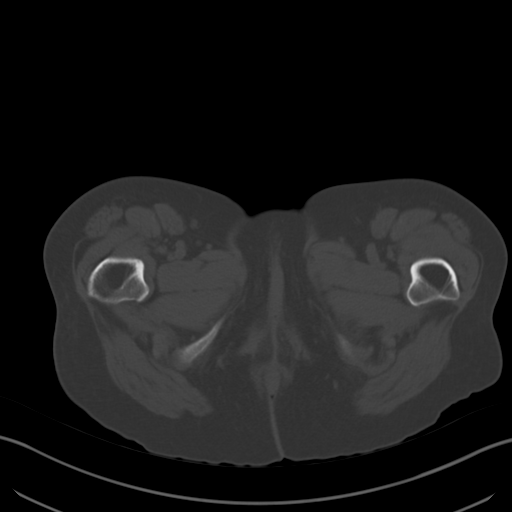
[im 12/90  soft-tissue]
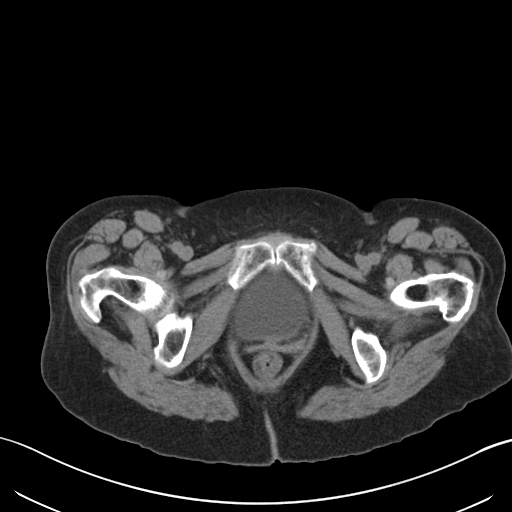
[im 19/90  soft-tissue]
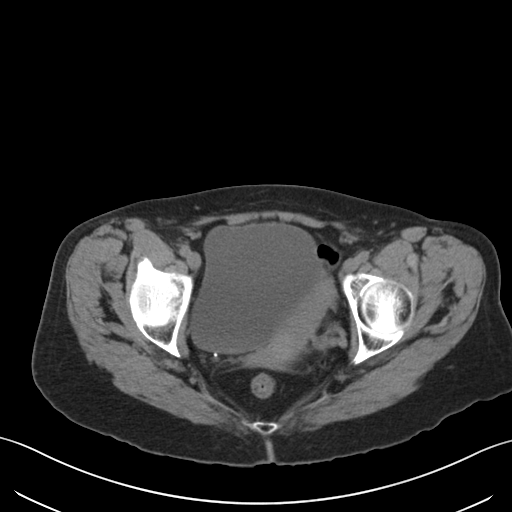
[im 26/90  soft-tissue]
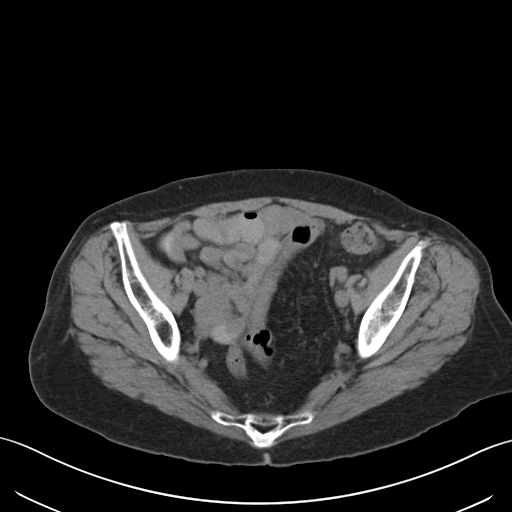
[im 30/90  soft-tissue]
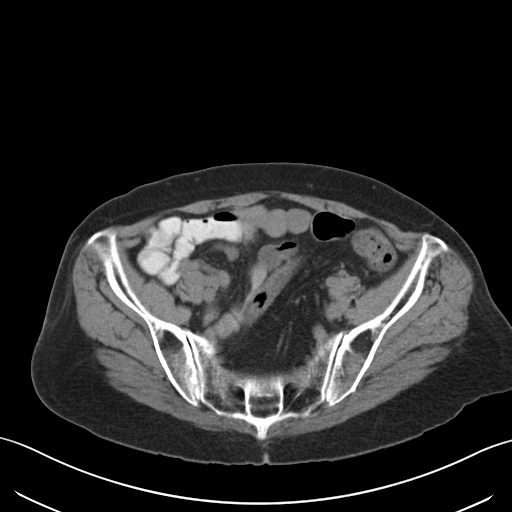
[im 38/90  soft-tissue]
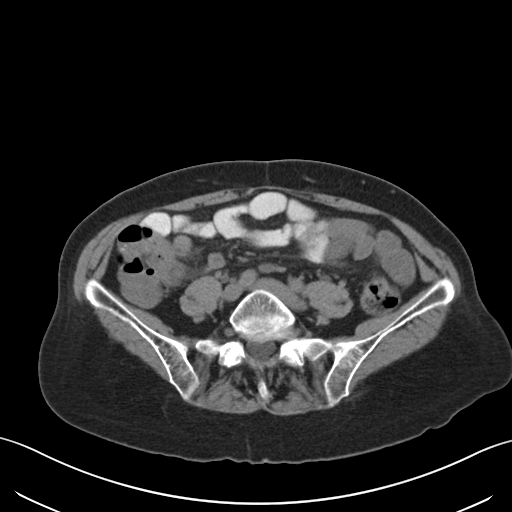
[im 45/90  soft-tissue]
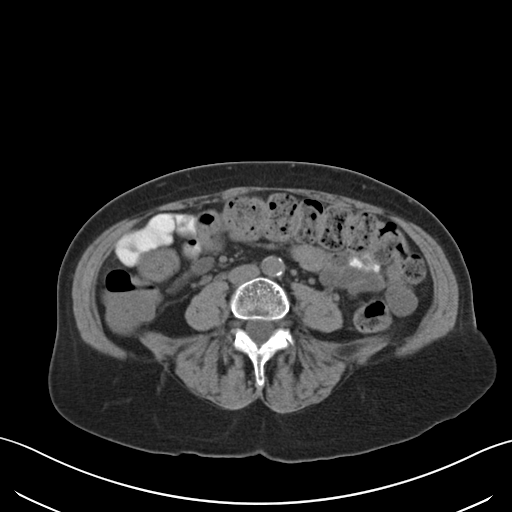
[im 52/90  soft-tissue]
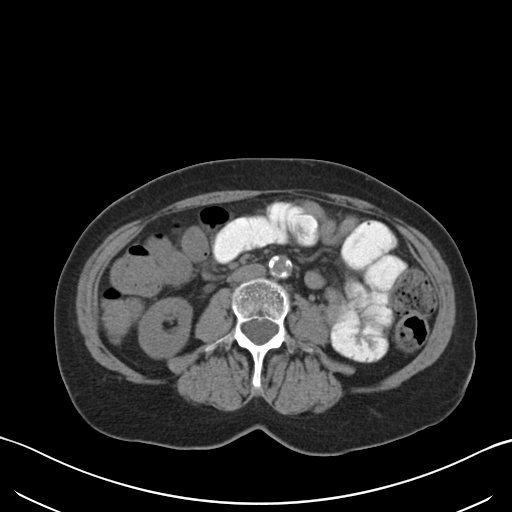
[im 60/90  soft-tissue]
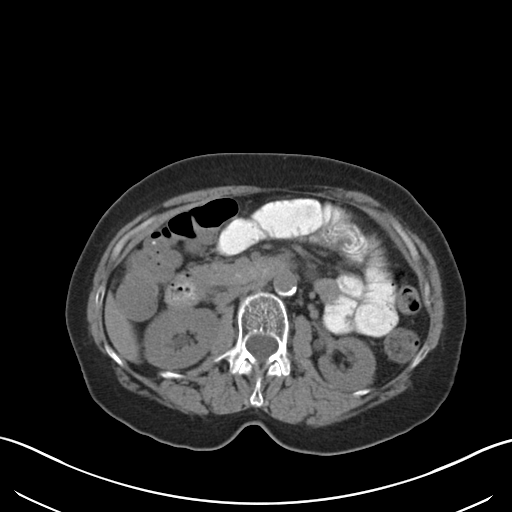
[im 60/90  bone]
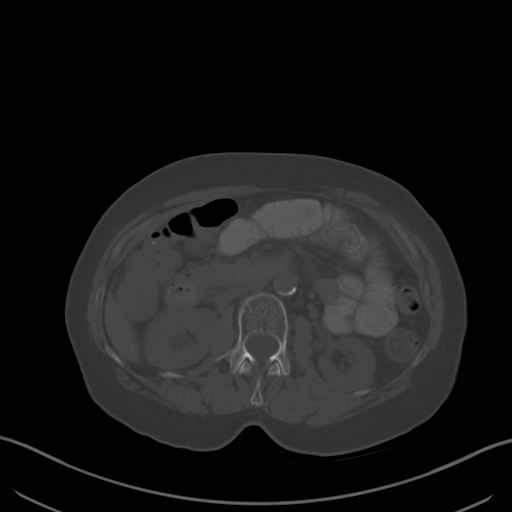
[im 64/90  soft-tissue]
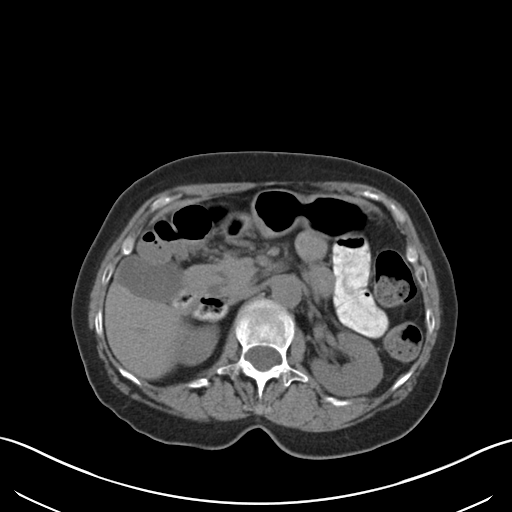
[im 71/90  soft-tissue]
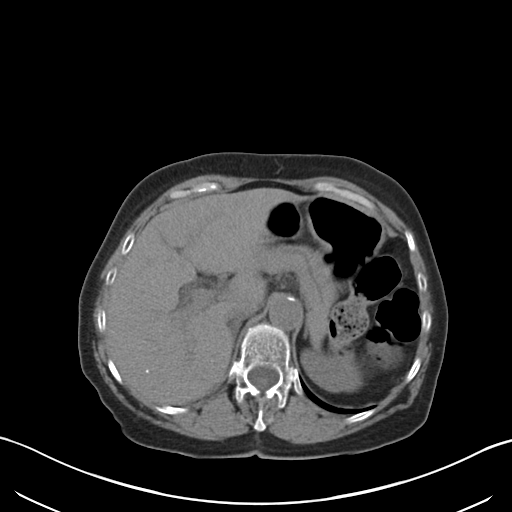
[im 75/90  lung]
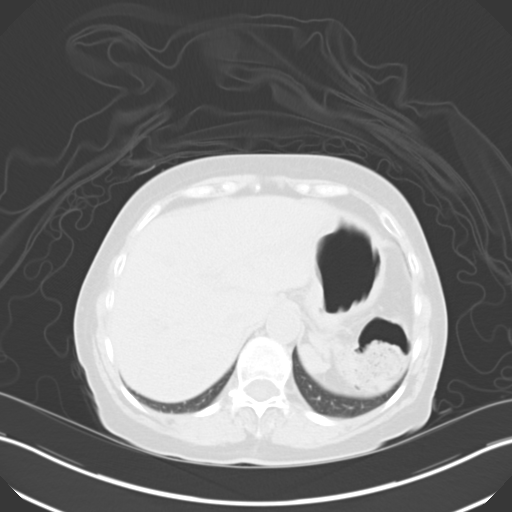
[im 78/90  soft-tissue]
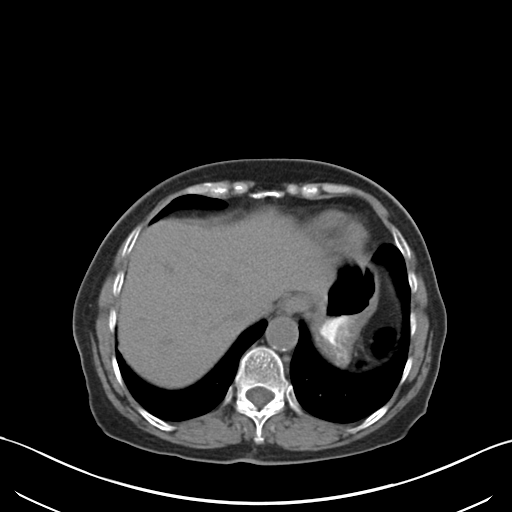
[im 78/90  lung]
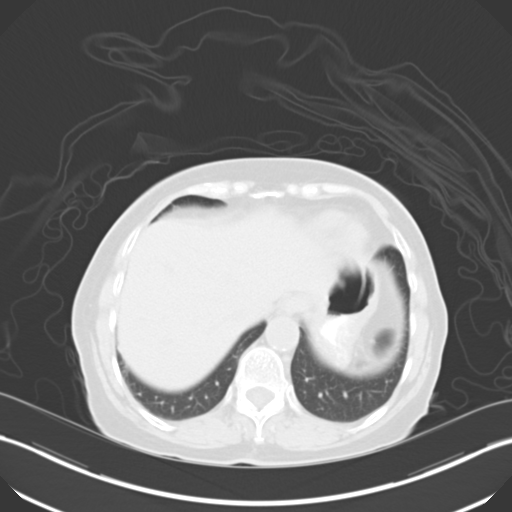
[im 82/90  lung]
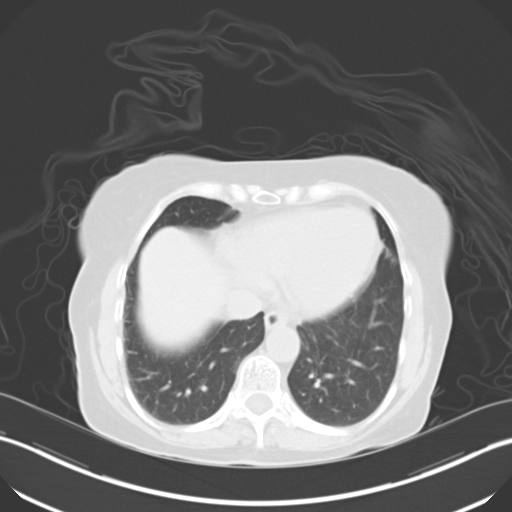
[im 86/90  soft-tissue]
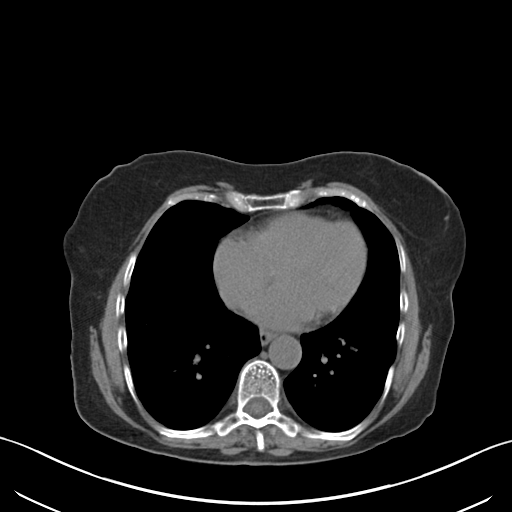
[im 86/90  lung]
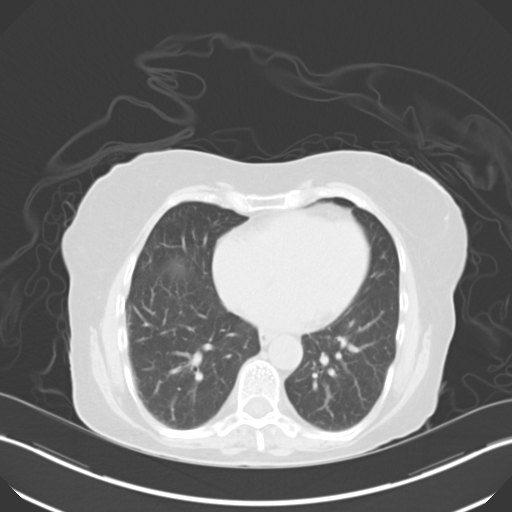

[15 of 32 positions shown; findings below may reference images not displayed]

FINDINGS: Lower chest:  Lung bases are clear.

Hepatobiliary: No focal hepatic lesion. Gallbladder is distended to
3.5 cm. No gallbladder inflammation.

Pancreas: Pancreas is normal. No ductal dilatation. No pancreatic
inflammation. There is a prominent pancreatic accessory duct which
could indicate pancreas divisum.

Spleen: Prior splenectomy. Several small is splenule is left upper
quadrant.

Adrenals/urinary tract: Adrenal glands are normal. Kidneys, ureters
and bladder normal. The right ureter is mildly dilated but there is
no obstructing calculus.

Stomach/Bowel: Stomach, small bowel, and cecum are normal. The
appendix is dilated to 11 mm in the mid appendix and up to 11 mm the
tail (image 49 and 45). Appendix is fluid-filled. There is no
periappendiceal inflammation. Towards the orifice of the appendix
there is mild hyperemia seen on sagittal image 34 (series 6). The
appendix extends medial from the cecum and turns cephalad along the
psoas muscle.

Colon and rectosigmoid colon are normal. No evidence of colonic
inflammation or obstruction. There is a periampullary duodenum
diverticulum along the C-loop of the duodenum measuring 2 cm (image
27, series 2.

Vascular/Lymphatic: Dull aorta is mildly calcified. No
retroperitoneal periportal lymphadenopathy.

Reproductive: Uterus and ovaries are normal.

Musculoskeletal: No aggressive osseous lesion.

Other: No free fluid in the abdomen or pelvis.
IMPRESSION: 1. Appendix is mildly hyperemic at the base and dilated at the tip.
While there is no periappendiceal inflammation, findings are
concerning for early appendicitis.
2. Mildly dilated right ureter without obstructing lesion
identified. No ureteral calculus or bladder calculi.
3. Incidental note of the duodenum diverticulum.
Findings conveyed [REDACTED] on 09/09/2014  at[DATE].

## 2020-12-07 DEATH — deceased
